# Patient Record
Sex: Male | Born: 1965
Health system: Southern US, Community
[De-identification: ages and names within clinical notes are randomized; demographics above are authoritative.]

## PROBLEM LIST (undated history)

## (undated) DIAGNOSIS — E079 Disorder of thyroid, unspecified: Secondary | ICD-10-CM

## (undated) HISTORY — PX: SHOULDER SURGERY: SHX246

## (undated) HISTORY — DX: Disorder of thyroid, unspecified: E07.9

---

## 2011-10-02 ENCOUNTER — Emergency Department: Payer: Self-pay | Admitting: *Deleted
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
## 2013-03-24 ENCOUNTER — Ambulatory Visit: Payer: Self-pay | Admitting: Family Medicine

## 2013-03-26 ENCOUNTER — Encounter: Payer: Self-pay | Admitting: *Deleted

## 2013-04-10 ENCOUNTER — Ambulatory Visit (INDEPENDENT_AMBULATORY_CARE_PROVIDER_SITE_OTHER): Payer: BC Managed Care – PPO | Admitting: General Surgery

## 2013-04-10 ENCOUNTER — Encounter: Payer: Self-pay | Admitting: General Surgery

## 2013-04-10 VITALS — BP 142/80 | HR 88 | Resp 16 | Ht 71.0 in | Wt 224.0 lb

## 2013-04-10 DIAGNOSIS — E042 Nontoxic multinodular goiter: Secondary | ICD-10-CM | POA: Insufficient documentation

## 2013-04-10 NOTE — Progress Notes (Signed)
Patient ID: Luis Ade., male   DOB: September 01, 1966, 47 y.o.   MRN: [id number]  Chief Complaint  Patient presents with  . Other    evaluation of thryoid - multiple nodules bilaterally     HPI [name] is a 47 y.o. male who presents for an evaluation of his thyroid. Multiple nodules noted bilaterally. The patient denies any problems at this time. The patient was diagnosed at age 2 with hypothyroidism. He has no details of any evaluations done then.   HPI  Past Medical History  Diagnosis Date  . Thyroid disease     Past Surgical History  Procedure Laterality Date  . Shoulder surgery Right     History reviewed. No pertinent family history.  Social History History  Substance Use Topics  . Smoking status: Never Smoker   . Smokeless tobacco: Not on file  . Alcohol Use: No    No Known Allergies  Current Outpatient Prescriptions  Medication Sig Dispense Refill  . levothyroxine (SYNTHROID, LEVOTHROID) 200 MCG tablet Take 200 mcg by mouth daily before breakfast.       No current facility-administered medications for this visit.    Review of Systems Review of Systems  Constitutional: Negative.   Respiratory: Negative.   Cardiovascular: Negative.     Blood pressure 142/80, pulse 88, resp. rate 16, height 5\' 11"  (1.803 m), weight 224 lb (101.606 kg).  Physical Exam Physical Exam  Constitutional: He is oriented to person, place, and time. He appears well-developed and well-nourished.  Eyes: Conjunctivae are normal. No scleral icterus.  Neck:  Right lobe of thyroid is a little enlarged. Soft.   Cardiovascular: Normal rate, regular rhythm and normal heart sounds.   No murmur heard. Pulmonary/Chest: Effort normal and breath sounds normal.  Lymphadenopathy:    He has no cervical adenopathy.    He has no axillary adenopathy.  Neurological: He is alert and oriented to person, place, and time.  Skin: Skin is warm and dry.    Data Reviewed Ultrasound  report showing multiple nodules in both lobes.   Assessment    Given the long standing history of hypothyroid state the ultrasound findings are also likely chronic and can be followed.      Plan    Patient to return in 6 months for follow up with in office thyroid ultrasound. Discussed assessment and plan in full with patient.        Song Myre G 04/11/2013, 6:21 AM

## 2013-04-10 NOTE — Patient Instructions (Addendum)
Patient to return in 6 months for follow up with in office thyroid ultrasound.

## 2013-04-11 ENCOUNTER — Encounter: Payer: Self-pay | Admitting: General Surgery

## 2013-10-14 ENCOUNTER — Ambulatory Visit: Payer: Self-pay | Admitting: General Surgery

## 2013-10-16 ENCOUNTER — Encounter: Payer: Self-pay | Admitting: *Deleted

## 2014-08-13 IMAGING — US THYROID ULTRASOUND
1 series · 13 of 25 positions shown · non-contrast
Comparison: none

REASON FOR EXAM: Goiter
COMMENTS:

[Series 1: thyroid ultrasound · 0.11mm/px · 13 of 113 slices shown]
[im 1/113]
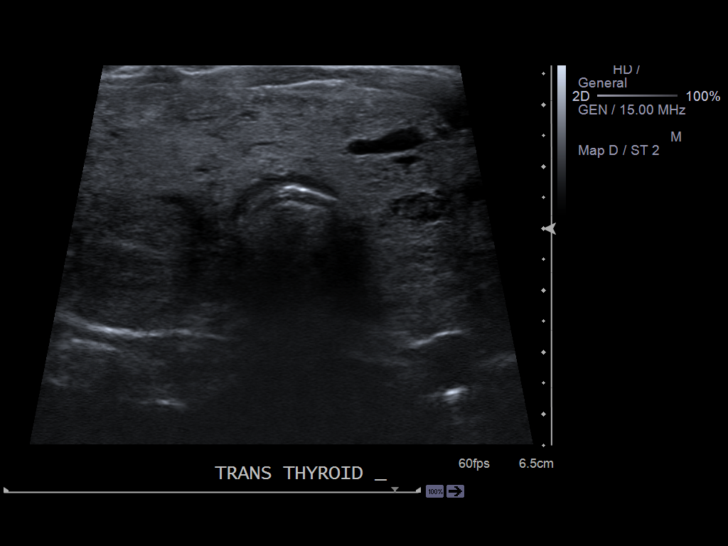
[im 10/113]
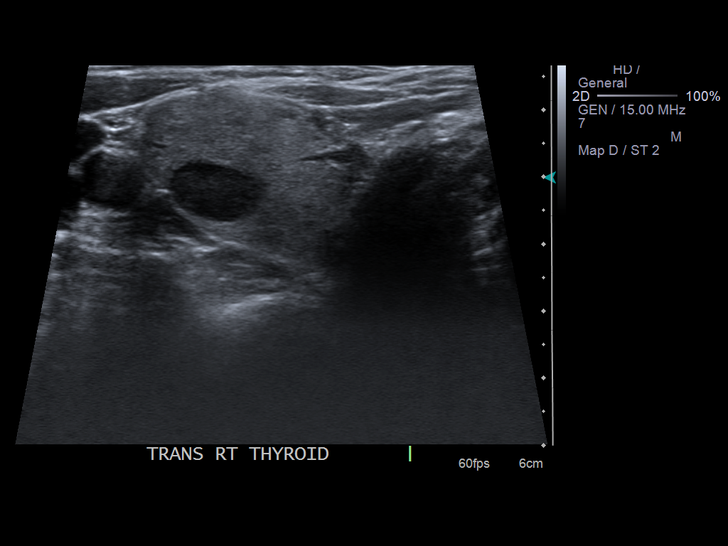
[im 19/113]
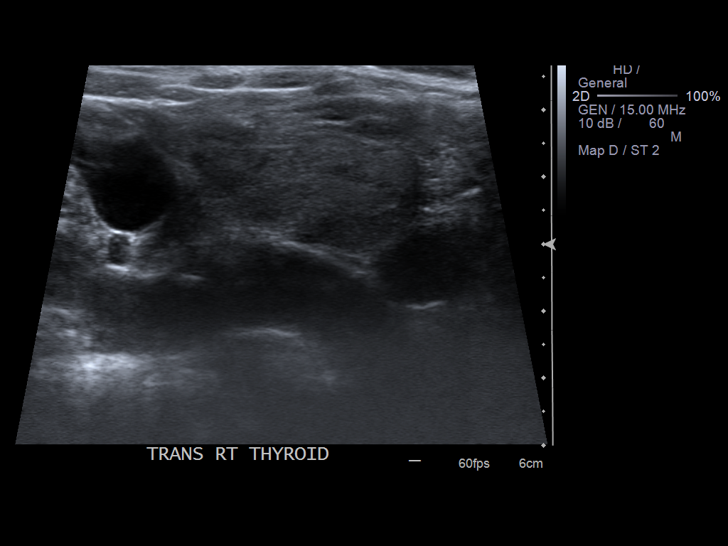
[im 29/113]
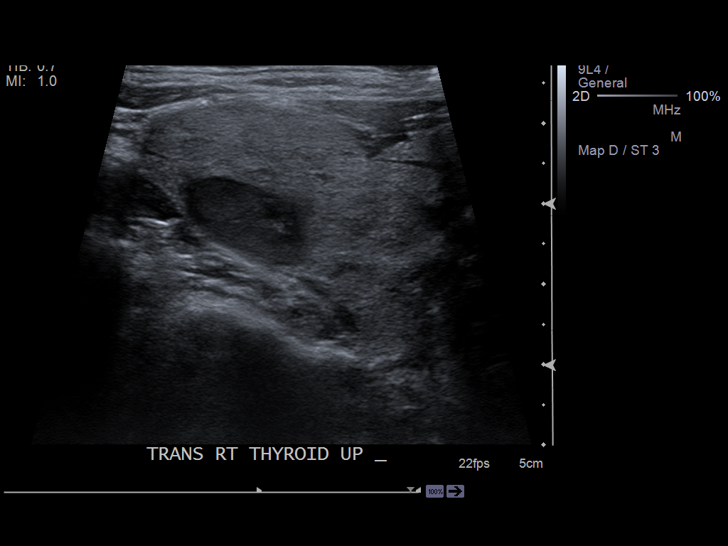
[im 38/113]
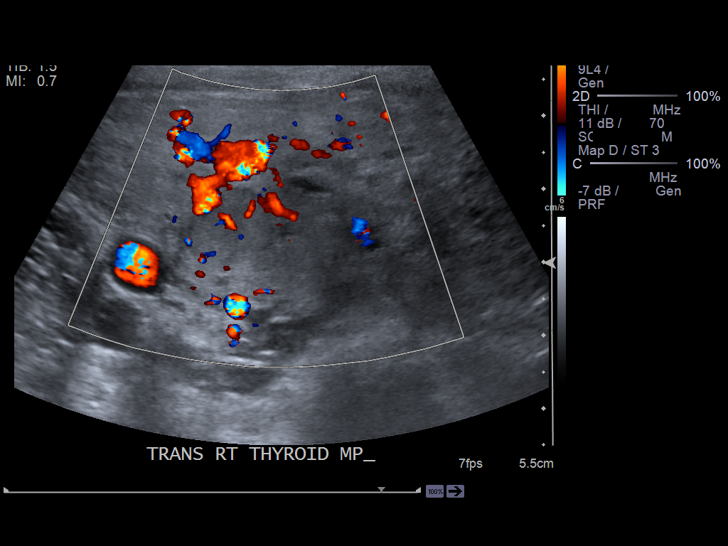
[im 47/113]
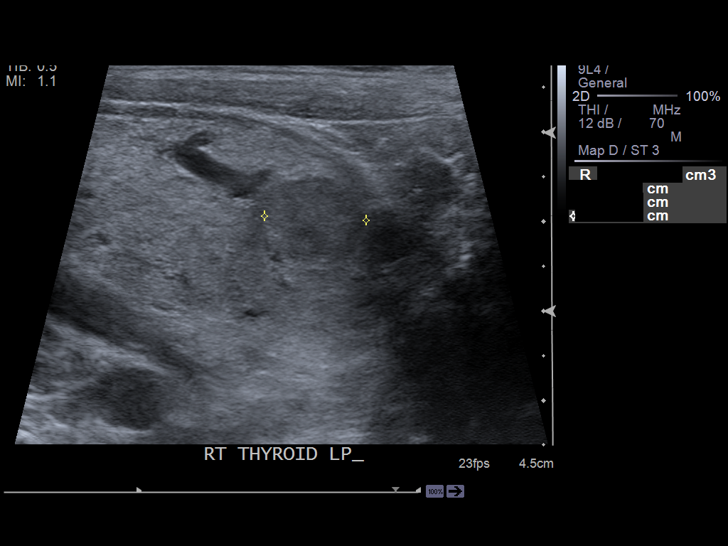
[im 57/113]
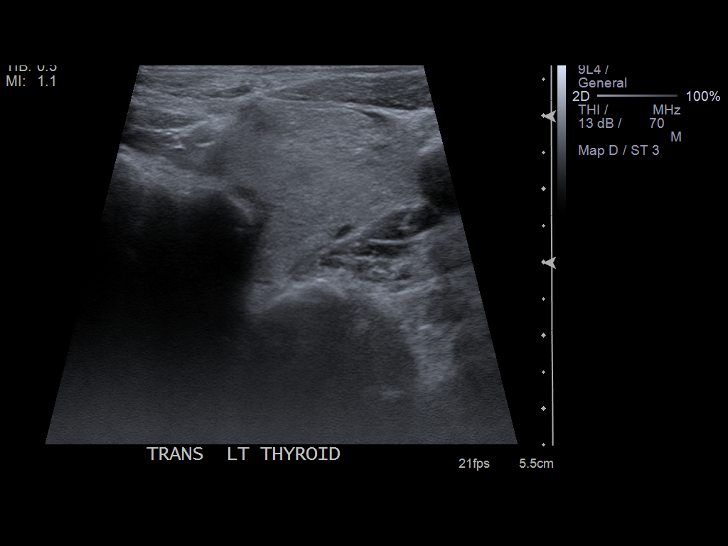
[im 66/113]
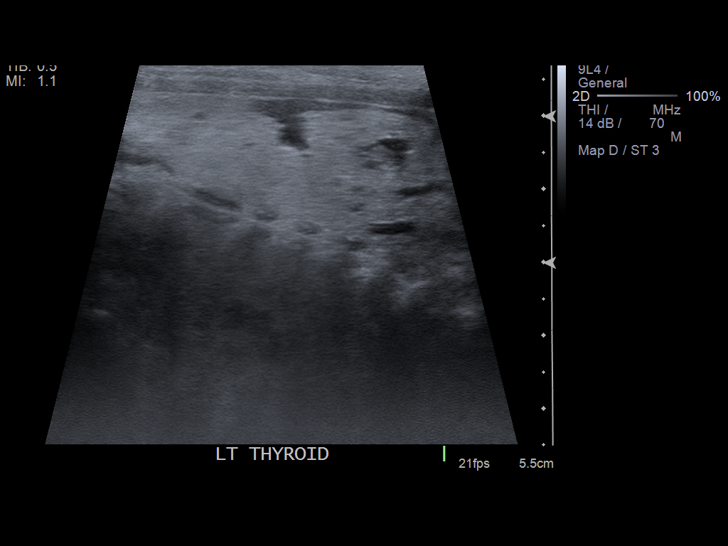
[im 75/113]
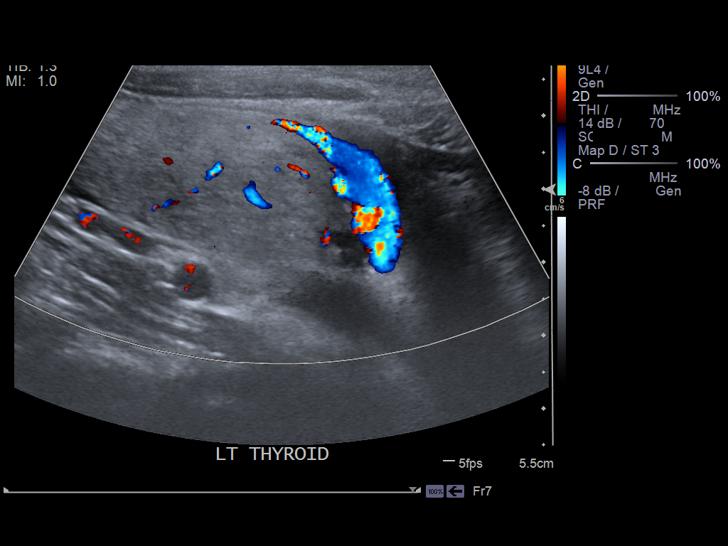
[im 85/113]
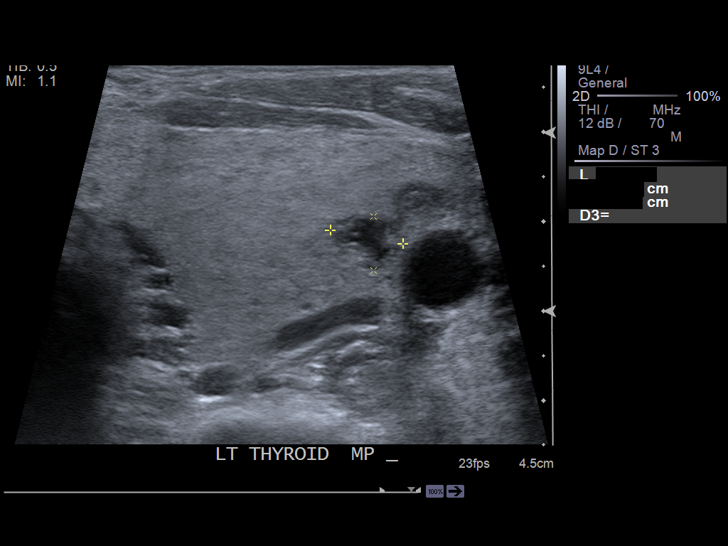
[im 94/113]
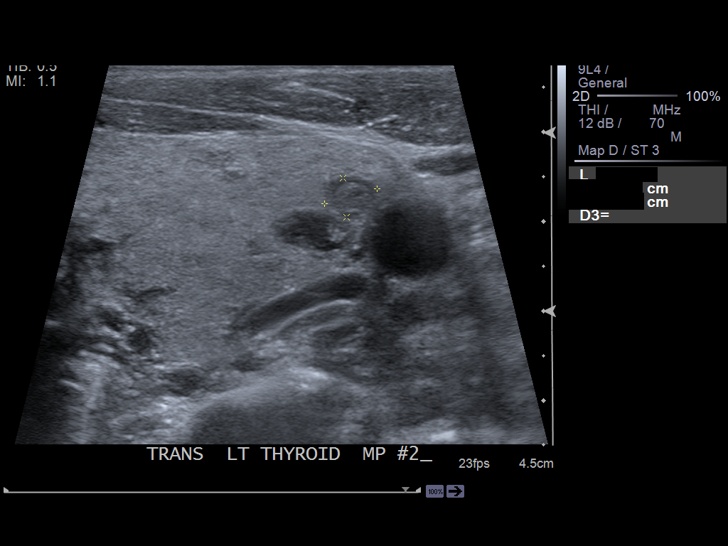
[im 103/113]
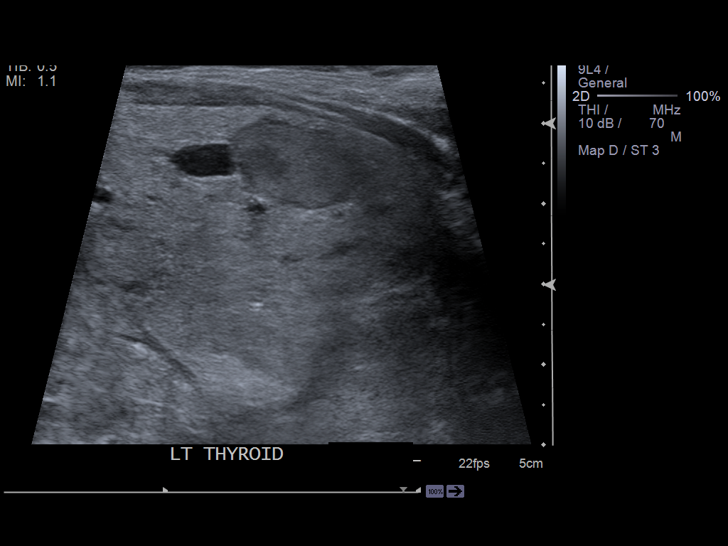
[im 113/113]
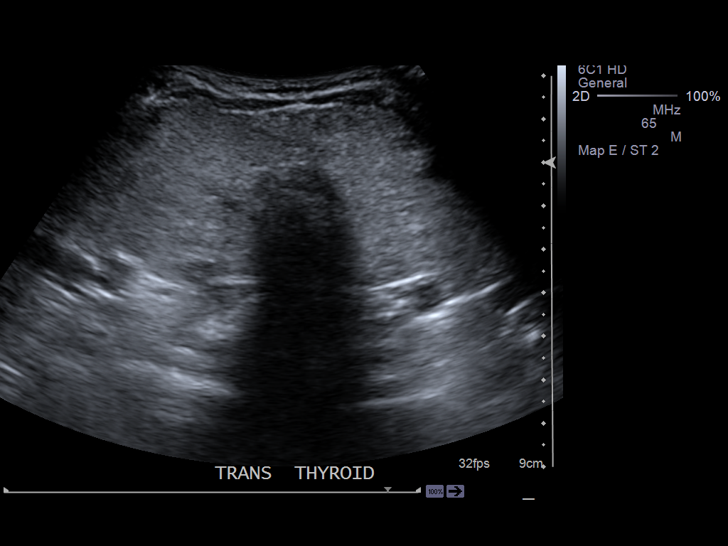

[13 of 25 positions shown; findings below may reference images not displayed]

PROCEDURE:     TIGER - TIGER SOFT TISSUE HEAD/NECK/THYROI  - March 24, 2013  [DATE]

RESULT:     The thyroid gland is enlarged. The right thyroid lobe measures
7.7 x 4.9 x 3.6 cm. The left thyroid lobe measures 6.7 x 4.6 x 3.3 cm.
Multiple nodules are noted in both thyroid lobes. The thyroid isthmus is
enlarged measuring 1.42 cm.

On the right in the upper pole there is a hypoechoic vascular nodule
measuring 1.3 x 0.8 x 2.3 cm. In the midpole there is a slightly hypoechoic
mass demonstrating vascularity measuring 1.2 x 1.1 x 1.1 cm. In the lower
pole there is a hypoechoic nodule with vascularity measuring 1.3 x 0.9 x
centimeters.

In the upper pole of the left thyroid lobe a complex cystic structure with
associated calcifications is demonstrated measuring 0.5 x 0.3 x 0.5 cm. In
the midpole a complex cystic-appearing structure measures 0.8 x 0.6 x
cm. Also in the midpole a hypoechoic nodule measures 0.6 x 0.4 x 0.6 cm. In
the lower pole there is a slightly hypoechoic mass demonstrating vascularity
measuring 1.3 x 1.0 x 1.67 cm.
IMPRESSION: There is enlargement of both thyroid lobes and multiple
nodules are demonstrated.

[REDACTED]

## 2015-07-27 ENCOUNTER — Ambulatory Visit (INDEPENDENT_AMBULATORY_CARE_PROVIDER_SITE_OTHER): Payer: Self-pay | Admitting: Family Medicine

## 2015-07-27 ENCOUNTER — Encounter: Payer: Self-pay | Admitting: Family Medicine

## 2015-07-27 VITALS — BP 116/77 | HR 94 | Temp 98.7°F | Resp 18 | Ht 71.0 in | Wt 215.8 lb

## 2015-07-27 DIAGNOSIS — J40 Bronchitis, not specified as acute or chronic: Secondary | ICD-10-CM

## 2015-07-27 DIAGNOSIS — L309 Dermatitis, unspecified: Secondary | ICD-10-CM | POA: Insufficient documentation

## 2015-07-27 DIAGNOSIS — J069 Acute upper respiratory infection, unspecified: Secondary | ICD-10-CM

## 2015-07-27 MED ORDER — BETAMETHASONE DIPROPIONATE 0.05 % EX CREA: TOPICAL_CREAM | Freq: Two times a day (BID) | CUTANEOUS | Status: DC

## 2015-07-27 MED ORDER — AZITHROMYCIN 250 MG PO TABS: ORAL_TABLET | ORAL | Status: DC

## 2015-07-27 MED ORDER — GUAIFENESIN-CODEINE 100-10 MG/5ML PO SOLN: 5.0000 mL | Freq: Every evening | ORAL | Status: DC | PRN

## 2015-07-27 MED ORDER — PSEUDOEPHEDRINE HCL 60 MG PO TABS: 60.0000 mg | ORAL_TABLET | Freq: Four times a day (QID) | ORAL | Status: DC | PRN

## 2015-07-27 MED ORDER — BENZONATATE 100 MG PO CAPS: 100.0000 mg | ORAL_CAPSULE | Freq: Three times a day (TID) | ORAL | Status: DC | PRN

## 2015-07-27 NOTE — Assessment & Plan Note (Signed)
2/2 transmission fluid. Treat with diprolene cream PRN. Recommend Amlactin lotions or Vaseline intensive lotion.

## 2015-07-27 NOTE — Patient Instructions (Signed)
Your symptoms are consistent with a viral upper respiratory infection. At this time there is no need for antibiotics.  If your symptoms persist for > 10 days or get better and than worsen please let me know. You may have a secondary bacterial infection.  You can use supportive care at home to help with your symptoms.I have also sent tesslon perles to your pharmacy to help with the cough- you can take these 3 times daily as needed. Honey is a natural cough suppressant- so add it to your tea in the morning.  If you have a humidifer, set that up in your bedroom at night.   Please seek immediate medical attention if you develop shortness of breath not relieve by inhaler, chest pain/tightness, fever > 103 F or other concerning symptoms.

## 2015-07-27 NOTE — Progress Notes (Signed)
Subjective:    Patient ID: Luis Ade., male    DOB: 1966/05/10, 49 y.o.   MRN: [id number]  HPI: Luis Huy. is a 49 y.o. male presenting on 07/27/2015 for No chief complaint on file.   HPI  Cough cold congestion. Symptoms 4-5 days ago. Worst symptom is coughing. Keeps him awake at night. No known fevers at home. Mild sinus pressure. No pain in ears. Chest hurts when coughing. No chest tightness or shortness of breath.   Past Medical History  Diagnosis Date  . Thyroid disease     Current Outpatient Prescriptions on File Prior to Visit  Medication Sig  . levothyroxine (SYNTHROID, LEVOTHROID) 200 MCG tablet Take 200 mcg by mouth daily before breakfast.   No current facility-administered medications on file prior to visit.    Review of Systems  Constitutional: Negative for fever and chills.  HENT: Positive for congestion, rhinorrhea and sneezing. Negative for ear pain and postnasal drip.   Respiratory: Positive for cough. Negative for chest tightness, shortness of breath and wheezing.   Cardiovascular: Negative for chest pain, palpitations and leg swelling.  Skin: Positive for rash.  Allergic/Immunologic: Negative for environmental allergies.  Neurological: Negative for headaches.   Per HPI unless specifically indicated above     Objective:    BP 116/77 mmHg  Pulse 94  Temp(Src) 98.7 F (37.1 C) (Oral)  Resp 18  Ht  (1.803 m)  Wt 215 lb 12.8 oz (97.886 kg)  BMI 30.11 kg/m2  SpO2 97%  Wt Readings from Last 3 Encounters:  07/27/15 215 lb 12.8 oz (97.886 kg)  04/10/13 224 lb (101.606 kg)    Physical Exam  Constitutional: He appears well-developed and well-nourished. No distress.  HENT:  Head: Normocephalic and atraumatic.  Right Ear: Hearing and tympanic membrane normal. Tympanic membrane is not erythematous and not bulging.  Left Ear: Hearing and tympanic membrane normal. Tympanic membrane is not erythematous and not bulging.  Nose: Mucosal  edema and rhinorrhea present. No sinus tenderness or nasal septal hematoma. Right sinus exhibits no maxillary sinus tenderness and no frontal sinus tenderness. Left sinus exhibits no maxillary sinus tenderness and no frontal sinus tenderness.  Mouth/Throat: Uvula is midline and mucous membranes are normal. No uvula swelling. Posterior oropharyngeal erythema present. No posterior oropharyngeal edema.  Neck: Neck supple. No Brudzinski's sign and no Kernig's sign noted. Thyromegaly (known goiter. ) present.  Cardiovascular: Normal rate, regular rhythm and normal heart sounds.   Pulmonary/Chest: No accessory muscle usage. No tachypnea. No respiratory distress. He has wheezes (with coughing. ) in the right lower field and the left lower field. He has no rhonchi. He has no rales. Chest wall is not dull to percussion. He exhibits no tenderness.  Lymphadenopathy:    He has no cervical adenopathy.  Skin:  Dry cracked skin bilateral hands.    No results found for this or any previous visit.    Assessment & Plan:   Problem List Items Addressed This Visit      Respiratory   Upper respiratory infection - Primary    Likely viral etiology. Paper prescription and instructions for filling given since holiday weekend. Encouraged home care. S/s secondary bacterial infection reviewed. Alarm symptoms reviewed.       Relevant Medications   azithromycin (ZITHROMAX) 250 MG tablet     Musculoskeletal and Integument   Chronic dermatitis of hands    2/2 transmission fluid. Treat with diprolene cream PRN. Recommend Amlactin lotions or Vaseline intensive  lotion.       Relevant Medications   betamethasone dipropionate (DIPROLENE) 0.05 % cream    Other Visit Diagnoses    Bronchitis        Likley viral. Zpak given if symptoms do not improve over the weekend.     Relevant Medications    benzonatate (TESSALON) 100 MG capsule    guaiFENesin-codeine 100-10 MG/5ML syrup       Meds ordered this encounter    Medications  . betamethasone dipropionate (DIPROLENE) 0.05 % cream    Sig: Apply topically 2 (two) times daily.    Dispense:  30 g    Refill:  3    Order Specific Question:  Supervising Provider    Answer:  Janeann ForehandHAWKINS JR, JAMES H [[id number]]  . benzonatate (TESSALON) 100 MG capsule    Sig: Take 1 capsule (100 mg total) by mouth 3 (three) times daily as needed for cough.    Dispense:  30 capsule    Refill:  0    Order Specific Question:  Supervising Provider    Answer:  Janeann ForehandHAWKINS JR, JAMES H [[id number]]  . guaiFENesin-codeine 100-10 MG/5ML syrup    Sig: Take 5 mLs by mouth at bedtime as needed for cough.    Dispense:  120 mL    Refill:  0    Order Specific Question:  Supervising Provider    Answer:  Janeann ForehandHAWKINS JR, JAMES H [[id number]]  . azithromycin (ZITHROMAX) 250 MG tablet    Sig: Take 2 pills on day 1 and 1 pill every subsequent day until bottle is empty.    Dispense:  6 tablet    Refill:  0    Order Specific Question:  Supervising Provider    Answer:  Janeann ForehandHAWKINS JR, JAMES H [[id number]]  . pseudoephedrine (SUDAFED) 60 MG tablet    Sig: Take 1 tablet (60 mg total) by mouth every 6 (six) hours as needed for congestion.    Dispense:  30 tablet    Refill:  0    Order Specific Question:  Supervising Provider    Answer:  Janeann ForehandHAWKINS JR, JAMES H [[id number]]      Follow up plan: Return if symptoms worsen or fail to improve.

## 2015-07-27 NOTE — Assessment & Plan Note (Signed)
Likely viral etiology. Paper prescription and instructions for filling given since holiday weekend. Encouraged home care. S/s secondary bacterial infection reviewed. Alarm symptoms reviewed.

## 2015-12-16 ENCOUNTER — Telehealth: Payer: Self-pay | Admitting: Family Medicine

## 2015-12-16 MED ORDER — LEVOTHYROXINE SODIUM 200 MCG PO TABS: 200.0000 ug | ORAL_TABLET | Freq: Every day | ORAL | Status: DC

## 2015-12-16 NOTE — Telephone Encounter (Signed)
Done. Sent to [facility].

## 2015-12-16 NOTE — Telephone Encounter (Signed)
Pt has appt on Monday 17th but will be out of medication before then.  [facility] [place]  His call back number is [phone]

## 2015-12-20 ENCOUNTER — Ambulatory Visit: Payer: Self-pay | Admitting: Family Medicine

## 2015-12-20 ENCOUNTER — Encounter: Payer: Self-pay | Admitting: Family Medicine

## 2015-12-21 ENCOUNTER — Encounter: Payer: Self-pay | Admitting: Family Medicine

## 2015-12-21 ENCOUNTER — Ambulatory Visit (INDEPENDENT_AMBULATORY_CARE_PROVIDER_SITE_OTHER): Payer: Self-pay | Admitting: Family Medicine

## 2015-12-21 VITALS — BP 129/87 | HR 94 | Temp 98.1°F | Resp 16 | Ht 71.0 in | Wt 219.0 lb

## 2015-12-21 DIAGNOSIS — Z833 Family history of diabetes mellitus: Secondary | ICD-10-CM

## 2015-12-21 DIAGNOSIS — M7711 Lateral epicondylitis, right elbow: Secondary | ICD-10-CM | POA: Diagnosis not present

## 2015-12-21 DIAGNOSIS — E042 Nontoxic multinodular goiter: Secondary | ICD-10-CM

## 2015-12-21 DIAGNOSIS — E785 Hyperlipidemia, unspecified: Secondary | ICD-10-CM | POA: Diagnosis not present

## 2015-12-21 MED ORDER — NAPROXEN 500 MG PO TABS: 500.0000 mg | ORAL_TABLET | Freq: Two times a day (BID) | ORAL | Status: DC

## 2015-12-21 NOTE — Assessment & Plan Note (Signed)
Last US 2014- stable. Seen by ENT last year, recommended f/u US in 5 years (2019). Doing well on 200mcg levothyroxine. Check TSH today. RTC 6 mos.

## 2015-12-21 NOTE — Progress Notes (Signed)
Subjective:    Patient ID: Luis AdeBruce D Coffin Jr., male    DOB: [birth year], 50 y.o.   MRN: [id number]  HPI: Luis AdeBruce D Ruggirello Jr. is a 50 y.o. male presenting on 12/21/2015 for Hypothyroidism   HPI  Pt presents for follow-up of thyroid. Has multi-nodular goiter. Last US in our system was 2014. Pt saw [facility] last year- was told it was stable. Wanted to get an ultrasound 5 year follow-up. No issues swallowing. Feeling well. No cold intolerance. No weight gain. No weight loss. No heat intolerance.  Pt also reporting R elbow pain. Pain with repeated movements. Pain at lateral epicondyle. Pain is 4/10. Mild relief with advil. Pt would like a know if a brace would be helpful.  Would like health maintenance labs today. Has not had cholesterol checked in several years. Stays active with job but drinks 5-6 sodas per day. Would like to screen for diabetes.     Past Medical History  Diagnosis Date  . Thyroid disease     Current Outpatient Prescriptions on File Prior to Visit  Medication Sig  . betamethasone dipropionate (DIPROLENE) 0.05 % cream Apply topically 2 (two) times daily.  Marland Kitchen. levothyroxine (SYNTHROID, LEVOTHROID) 200 MCG tablet Take 1 tablet (200 mcg total) by mouth daily before breakfast.   No current facility-administered medications on file prior to visit.    Review of Systems  Constitutional: Negative for fever and chills.  HENT: Negative.   Respiratory: Negative for chest tightness, shortness of breath and wheezing.   Cardiovascular: Negative for chest pain, palpitations and leg swelling.  Gastrointestinal: Negative for nausea, vomiting and abdominal pain.  Endocrine: Negative.   Genitourinary: Negative for dysuria, urgency, discharge, penile pain and testicular pain.  Musculoskeletal: Negative for back pain, joint swelling and arthralgias.  Skin: Negative.   Neurological: Negative for dizziness, weakness, numbness and headaches.  Psychiatric/Behavioral: Negative for sleep  disturbance and dysphoric mood.   Per HPI unless specifically indicated above     Objective:    BP 129/87 mmHg  Pulse 94  Temp(Src) 98.1 F (36.7 C) (Oral)  Resp 16  Ht 5\' 11"  (1.803 m)  Wt 219 lb (99.338 kg)  BMI 30.56 kg/m2  Wt Readings from Last 3 Encounters:  12/21/15 219 lb (99.338 kg)  07/27/15 215 lb 12.8 oz (97.886 kg)  04/10/13 224 lb (101.606 kg)    Physical Exam  Constitutional: He is oriented to person, place, and time. He appears well-developed and well-nourished. No distress.  HENT:  Head: Normocephalic and atraumatic.  Neck: Normal range of motion. Neck supple. Thyromegaly present.  Cardiovascular: Normal rate, regular rhythm and normal heart sounds.  Exam reveals no gallop and no friction rub.   No murmur heard. Pulmonary/Chest: Effort normal and breath sounds normal. He has no wheezes.  Abdominal: Soft. Bowel sounds are normal. He exhibits no distension. There is no tenderness. There is no rebound.  Musculoskeletal: Normal range of motion. He exhibits no edema.       Right elbow: Tenderness found. Lateral epicondyle tenderness noted.  Lymphadenopathy:    He has no cervical adenopathy.  Neurological: He is alert and oriented to person, place, and time. He has normal reflexes.  Skin: Skin is warm and dry. No rash noted. No erythema.  Psychiatric: He has a normal mood and affect. His behavior is normal. Thought content normal.   No results found for this or any previous visit.    Assessment & Plan:   Problem List Items Addressed This Visit  Endocrine   Multinodular goiter - Primary    Last [place] 2014- stable. Seen by ENT last year, recommended f/u US in 5 years (2019). Doing well on levothyroxine. Check TSH today. RTC 6 mos.       Relevant Orders   TSH   Comprehensive metabolic panel    Other Visit Diagnoses    Mild hyperlipidemia        Relevant Orders    Lipid panel    Epicondylitis, lateral (tennis elbow), right        Trial of  naproxen BID x1 week. Switch to OTC. Recommend OTC brace for pain. Return if symptoms not improving.     Relevant Medications    naproxen (NAPROSYN) 500 MG tablet    Family history of diabetes mellitus        Screen hemoglobin A1c. Discussed diabetes prevention.     Relevant Orders    Hemoglobin A1c       Meds ordered this encounter  Medications  . naproxen (NAPROSYN) 500 MG tablet    Sig: Take 1 tablet (500 mg total) by mouth 2 (two) times daily with a meal.    Dispense:  30 tablet    Refill:  0    Order Specific Question:  Supervising Provider    Answer:  Janeann Forehand [[id number]]      Follow up plan: Return in about 6 months (around 06/21/2016).

## 2015-12-21 NOTE — Patient Instructions (Signed)
Tennis Elbow Tennis elbow (lateral epicondylitis) is inflammation of the outer tendons of your forearm close to your elbow. Your tendons attach your muscles to your bones. The outer tendons of your forearm are used to extend your wrist, and they attach on the outside part of your elbow. Tennis elbow is often found in people who play tennis, but anyone may get the condition from repeatedly extending the wrist or turning the forearm. CAUSES This condition is caused by repeatedly extending your wrist and using your hands. It can result from sports or work that requires repetitive forearm movements. Tennis elbow may also be caused by an injury. RISK FACTORS You have a higher risk of developing tennis elbow if you play tennis or another racquet sport. You also have a higher risk if you frequently use your hands for work. This condition is also more likely to develop in:  Musicians.  Carpenters, painters, and plumbers.  Cooks.  Cashiers.  People who work in factories.  Construction workers.  Butchers.  People who use computers. SYMPTOMS Symptoms of this condition include:  Pain and tenderness in your forearm and the outer part of your elbow. You may only feel the pain when you use your arm, or you may feel it even when you are not using your arm.  A burning feeling that runs from your elbow through your arm.  Weak grip in your hands. DIAGNOSIS  This condition may be diagnosed by medical history and physical exam. You may also have other tests, including:  X-rays.  MRI. TREATMENT Your health care provider will recommend lifestyle adjustments, such as resting and icing your arm. Treatment may also include:  Medicines for inflammation. This may include shots of cortisone if your pain continues.  Physical therapy. This may include massage or exercises.  An elbow brace. Surgery may eventually be recommended if your pain does not go away with treatment. HOME CARE  INSTRUCTIONS Activity  Rest your elbow and wrist as directed by your health care provider. Try to avoid any activities that caused the problem until your health care provider says that you can do them again.  If a physical therapist teaches you exercises, do all of them as directed.  If you lift an object, lift it with your palm facing upward. This lowers the stress on your elbow. Lifestyle  If your tennis elbow is caused by sports, check your equipment and make sure that:  You are using it correctly.  It is the best fit for you.  If your tennis elbow is caused by work, take breaks frequently, if you are able. Talk with your manager about how to best perform tasks in a way that is safe.  If your tennis elbow is caused by computer use, talk with your manager about any changes that can be made to your work environment. General Instructions  If directed, apply ice to the painful area:  Put ice in a plastic bag.  Place a towel between your skin and the bag.  Leave the ice on for 20 minutes, 2-3 times per day.  Take medicines only as directed by your health care provider.  If you were given a brace, wear it as directed by your health care provider.  Keep all follow-up visits as directed by your health care provider. This is important. SEEK MEDICAL CARE IF:  Your pain does not get better with treatment.  Your pain gets worse.  You have numbness or weakness in your forearm, hand, or fingers.     This information is not intended to replace advice given to you by your health care provider. Make sure you discuss any questions you have with your health care provider.   Document Released: 08/21/2005 Document Revised: 01/05/2015 Document Reviewed: 08/17/2014 Elsevier Interactive Patient Education 2016 [employer]  

## 2016-01-07 ENCOUNTER — Other Ambulatory Visit: Payer: Self-pay | Admitting: Family Medicine

## 2016-01-07 LAB — COMPREHENSIVE METABOLIC PANEL
ALBUMIN: 4.2 g/dL (ref 3.5–5.5)
ALK PHOS: 75 IU/L (ref 39–117)
ALT: 30 IU/L (ref 0–44)
AST: 25 IU/L (ref 0–40)
Albumin/Globulin Ratio: 1.4 (ref 1.2–2.2)
BUN/Creatinine Ratio: 11 (ref 9–20)
BUN: 10 mg/dL (ref 6–24)
Bilirubin Total: 0.5 mg/dL (ref 0.0–1.2)
CHLORIDE: 103 mmol/L (ref 96–106)
CO2: 25 mmol/L (ref 18–29)
CREATININE: 0.87 mg/dL (ref 0.76–1.27)
Calcium: 9.1 mg/dL (ref 8.7–10.2)
GFR, EST AFRICAN AMERICAN: 118 mL/min/{1.73_m2} (ref >59)
GFR, EST NON AFRICAN AMERICAN: 102 mL/min/{1.73_m2} (ref >59)
GLOBULIN, TOTAL: 3.1 g/dL (ref 1.5–4.5)
GLUCOSE: 108 mg/dL — AB (ref 65–99)
Potassium: 4 mmol/L (ref 3.5–5.2)
Sodium: 143 mmol/L (ref 134–144)
TOTAL PROTEIN: 7.3 g/dL (ref 6.0–8.5)

## 2016-01-07 LAB — TSH: TSH: 1.6 u[IU]/mL (ref 0.450–4.500)

## 2016-01-07 LAB — LIPID PANEL
CHOL/HDL RATIO: 4.4 ratio (ref 0.0–5.0)
Cholesterol, Total: 144 mg/dL (ref 100–199)
HDL: 33 mg/dL — ABNORMAL LOW (ref >39)
LDL CALC: 88 mg/dL (ref 0–99)
Triglycerides: 116 mg/dL (ref 0–149)
VLDL CHOLESTEROL CAL: 23 mg/dL (ref 5–40)

## 2016-01-07 LAB — HEMOGLOBIN A1C
ESTIMATED AVERAGE GLUCOSE: 105 mg/dL
HEMOGLOBIN A1C: 5.3 % (ref 4.8–5.6)

## 2016-01-07 MED ORDER — LEVOTHYROXINE SODIUM 200 MCG PO TABS: 200.0000 ug | ORAL_TABLET | Freq: Every day | ORAL | Status: DC

## 2016-05-09 ENCOUNTER — Encounter: Payer: Self-pay | Admitting: Family Medicine

## 2016-05-09 ENCOUNTER — Ambulatory Visit (INDEPENDENT_AMBULATORY_CARE_PROVIDER_SITE_OTHER): Payer: BC Managed Care – PPO | Admitting: Family Medicine

## 2016-05-09 VITALS — BP 140/98 | HR 75 | Temp 97.5°F | Resp 16 | Ht 71.0 in | Wt 220.2 lb

## 2016-05-09 DIAGNOSIS — M545 Low back pain, unspecified: Secondary | ICD-10-CM

## 2016-05-09 MED ORDER — NAPROXEN 500 MG PO TABS: 500.0000 mg | ORAL_TABLET | Freq: Two times a day (BID) | ORAL | 1 refills | Status: DC

## 2016-05-09 NOTE — Progress Notes (Signed)
Subjective:    Patient ID: Luis Ade., male    DOB: [birth year], 50 y.o.   MRN: [id number]  Luis Ellenbecker. is a 50 y.o. male presenting on 05/09/2016 for Back Pain (onset 4 to 5 days)   HPI   LOW BACK PAIN, Acute Right: - Reports new onset symptoms started about 1 week ago without known inciting injury. Today seems to be worsening gradually today, woke up with about 5/10 pain significantly worse with movement, bending forward and twisting certain directions, currently sitting at rest without activity very minimal 1/10, previously pain only 2/10 and mostly intermittent. Currently employed building transmissions, some heavy lifting, also at home with playing with kids. No pain radiating to legs. - Has not tried any ibuprofen / NSAID for this flare - No history of prior lumbar OA/DJD, back surgery. No prior injury or back pain. - No history of kidney stone - Denies any fevers/chills, numbness, tingling, weakness, loss of control bladder/bowel incontinence or retention, unintentional wt loss, night sweats, dysuria, hematuria   Social History  Substance Use Topics  . Smoking status: Never Smoker  . Smokeless tobacco: Never Used  . Alcohol use No    Review of Systems Per HPI unless specifically indicated above     Objective:    BP (!) 140/98 (BP Location: Right Arm, Patient Position: Sitting, Cuff Size: Normal)   Pulse 75   Temp 97.5 F (36.4 C) (Oral)   Resp 16   Ht 5\' 11"  (1.803 m)   Wt 220 lb 3.2 oz (99.9 kg)   BMI 30.71 kg/m   Wt Readings from Last 3 Encounters:  05/09/16 220 lb 3.2 oz (99.9 kg)  12/21/15 219 lb (99.3 kg)  07/27/15 215 lb 12.8 oz (97.9 kg)    Physical Exam  Constitutional: He appears well-developed and well-nourished. No distress.  Well-appearing, comfortable, cooperative  Neck: Normal range of motion. Neck supple.  Cardiovascular: Normal rate, regular rhythm and normal heart sounds.   No murmur heard. Pulmonary/Chest: Effort normal and  breath sounds normal. No respiratory distress. He has no wheezes. He has no rales. He exhibits no tenderness.  Abdominal: Soft. Bowel sounds are normal. He exhibits no distension and no mass. There is no tenderness.  Musculoskeletal:  Low Back Inspection: Normal appearance, no spinal deformity, symmetrical. Palpation: No tenderness over spinous processes. Right lower lumbar paraspinal mild tenderness to palpation with minimal muscle spasm or hypertonicity ROM: Full active ROM forward flex / back extension, rotation L/R, very mild discomfort with full back flexion or extension Special Testing: Seated SLR negative for radicular pain bilaterally, but Right SLR with reproduced localized R back pain Strength: Bilateral hip flex/ext 5/5, knee flex/ext 5/5, ankle dorsiflex/plantarflex 5/5 Neurovascular: intact distal sensation to light touch   Neurological: He is alert.  Skin: Skin is warm and dry. He is not diaphoretic.  Nursing note and vitals reviewed.      Assessment & Plan:   Problem List Items Addressed This Visit    None    Visit Diagnoses    Acute right-sided low back pain without sciatica    -  Primary  Acute R LBP without associated sciatica. Suspect likely due to muscle spasm/strain with frequent lifting/strain on back but without known injury or trauma. No prior chronic LBP or DJD. - No red flag symptoms. Negative SLR for radiculopathy - Inadequate conservative therapy   Plan: 1. Start anti-inflammatory trial with rx Naprosyn 500mg  BID wc x 2-4 weeks, then PRN 2.  May use Tylenol PRN for breakthrough 3. Encouraged use of heating pad 1-2x daily for now then PRN 4. RTC 6 weeks, re-evaluation. If not improved consider X-ray imaging, muscle relaxant, trial PT         Meds ordered this encounter  Medications  . naproxen (NAPROSYN) 500 MG tablet    Sig: Take 1 tablet (500 mg total) by mouth 2 (two) times daily with a meal. For 2 weeks then as needed    Dispense:  30 tablet     Refill:  1      Follow up plan: Return in about 6 weeks (around 06/20/2016) for low back pain.   Saralyn PilarAlexander Jacoby Zanni, DO [facility] 05/09/2016, 9:10 AM

## 2016-05-09 NOTE — Patient Instructions (Signed)
Thank you for coming in to clinic today.  1. For your Back Pain - I think that this is due to Muscle Spasms or strain. You have no sign of nerve, disc, or other spine injury. Unlikely kidney stone or abdominal problem. 2. Start with anti-inflammatory Naprosyn (Naproxen) 500mg  twice daily (12 hrs apart, with food, breakfast and dinner) every day for next 2 to 4 weeks if helping, then can use only as needed 3. May use Tylenol Extra Str 500mg  tabs - may take 1-2 tablets every 6 hours as needed 4. Recommend to start using heating pad on your lower back 1-2x daily for few weeks  This pain may take weeks to months to fully resolve, but hopefully it will respond to the medicine initially. All back injuries (small or serious) are slow to heal since we use our back muscles every day. Be careful with turning, twisting, lifting, sitting / standing for prolonged periods, and avoid re-injury.  If your symptoms significantly worsen with more pain, or new symptoms with weakness in one or both legs, new or different shooting leg pains, numbness in legs or groin, loss of control or retention of urine or bowel movements, please call back for advice and you may need to go directly to the Emergency Department.  Please schedule a follow-up appointment with Dr. Althea CharonKaramalegos in 6 weeks to follow-up Low Back Pain if still present or sooner if worsening  If you have any other questions or concerns, please feel free to call the clinic or send a message through MyChart. You may also schedule an earlier appointment if necessary.  Saralyn PilarAlexander Sydnei Ohaver, DO [facility]

## 2016-09-28 ENCOUNTER — Other Ambulatory Visit: Payer: Self-pay | Admitting: Family Medicine

## 2016-09-28 ENCOUNTER — Ambulatory Visit (INDEPENDENT_AMBULATORY_CARE_PROVIDER_SITE_OTHER): Payer: BC Managed Care – PPO | Admitting: Family Medicine

## 2016-09-28 ENCOUNTER — Encounter: Payer: Self-pay | Admitting: Family Medicine

## 2016-09-28 VITALS — BP 135/83 | HR 87 | Temp 98.4°F | Resp 16 | Ht 71.0 in | Wt 221.0 lb

## 2016-09-28 DIAGNOSIS — R197 Diarrhea, unspecified: Secondary | ICD-10-CM | POA: Diagnosis not present

## 2016-09-28 DIAGNOSIS — E042 Nontoxic multinodular goiter: Secondary | ICD-10-CM

## 2016-09-28 DIAGNOSIS — Z0000 Encounter for general adult medical examination without abnormal findings: Secondary | ICD-10-CM

## 2016-09-28 DIAGNOSIS — E786 Lipoprotein deficiency: Secondary | ICD-10-CM | POA: Insufficient documentation

## 2016-09-28 DIAGNOSIS — R6889 Other general symptoms and signs: Secondary | ICD-10-CM

## 2016-09-28 MED ORDER — DICYCLOMINE HCL 10 MG PO CAPS: 10.0000 mg | ORAL_CAPSULE | Freq: Three times a day (TID) | ORAL | 0 refills | Status: DC

## 2016-09-28 NOTE — Patient Instructions (Signed)
Thank you for coming in to clinic today.  1. Most likely had flu previously, seems like that has run it's course. Diarrhea can be from antibiotic and/or stomach flu - No specific viral treatment - Sent rx for Bentyl to use for abdominal cramping / spasm, take one tab before each meal and at bedtime IF NEEDED for abdominal pain - Also my try Loperamide / Imodium OTC follow bottle, usually 4mg  or 2 pills first episode, then can repeat dose of 2 mg up to 2 hours later with each diarrhea episode, caution using too often only use for few days  IF symptoms significantly worsen with fevers/chills sweats, worsening abdominal pain, watery diarrhea blood in diarrhea, or other acute changes, then notify office for return follow-up or can go seek more immediate treatment at hospital.  You will be due for FASTING BLOOD WORK (no food or drink after midnight before, only water or coffee without cream/sugar on the morning of)  - Please go ahead and schedule a "Lab Only" visit in the morning at the clinic for lab draw in 3-4 months, AFTER 01/05/17 before next Annual Physical - Make sure Lab Only appointment is at least 1-2 weeks before your next appointment, so that results will be available  For Lab Results, once available within 2-3 days of blood draw, you can can log in to MyChart online to view your results and a brief explanation. Also, we can discuss results at next follow-up visit.  Please schedule a follow-up appointment with Dr. Althea CharonKaramalegos in 3-4 months for Annual Physical   If you have any other questions or concerns, please feel free to call the clinic or send a message through MyChart. You may also schedule an earlier appointment if necessary.  Saralyn PilarAlexander Amelia Burgard, DO [facility]

## 2016-09-28 NOTE — Progress Notes (Signed)
Subjective:    Patient ID: Luis AdeBruce D Massiah Jr., male    DOB: Oct 16, 1965, 51 y.o.   MRN: [id number]  Luis AdeBruce D Zehring Jr. is a 51 y.o. male presenting on 09/28/2016 for Abdominal Pain (onset week diarrhea pt thinks he had fever chills stomach cramps for week , flu like Sx is gone but still has stomach cramps)   HPI   ABDOMINAL CRAMPING / DIARRHEA / RECENT FLU-LIKE ILLNESS: Reports symptoms started 2 weeks ago with flu-like URI symptoms with congestion, cough, fevers, chills, muscle aches and generalized, also had some associated stomach flu symptoms with some diarrhea, then developed some cramping abdominal pain across abdomen. Tried various OTC cold / flu medications with some improvement, no positive contact with flu known, but in contact with large population - Over past few days to 1 week, now has resolved flu symptoms, but has lingering persistent abdominal pain and episodic diarrhea, some regular BMs in between, otherwise loose stools (not watery, non bloody). - Today moderately improved, less abdominal pain and cramping. Has been taking it easy and resting more recently. Still has some left sided abdominal cramping. Staying hydrated, tolerating bland foods and liquid well. - Also recently had a dental procedure root canal (Dr Pollyann SavoySheets, dentist) , was given 1 week of strong antibiotic, does not recall which one and was warned that he could get some diarrhea after this. No prior history of C.Diff - Tried OTC omeprazole once without improvement - Denies any current fevers/chills, sweats, nausea, vomiting, headache, chest pain, muscle aches, dyspnea  Social History  Substance Use Topics  . Smoking status: Never Smoker  . Smokeless tobacco: Never Used  . Alcohol use No    Review of Systems Per HPI unless specifically indicated above     Objective:    BP 135/83   Pulse 87   Temp 98.4 F (36.9 C) (Oral)   Resp 16   Ht 5\' 11"  (1.803 m)   Wt 221 lb (100.2 kg)   SpO2 99%   BMI 30.82  kg/m   Wt Readings from Last 3 Encounters:  09/28/16 221 lb (100.2 kg)  05/09/16 220 lb 3.2 oz (99.9 kg)  12/21/15 219 lb (99.3 kg)    Physical Exam  Constitutional: He appears well-developed and well-nourished. No distress.  Well-appearing, comfortable, cooperative  HENT:  Head: Normocephalic and atraumatic.  Mouth/Throat: Oropharynx is clear and moist.  Eyes: Conjunctivae are normal.  Cardiovascular: Normal rate, regular rhythm, normal heart sounds and intact distal pulses.   No murmur heard. Pulmonary/Chest: Effort normal and breath sounds normal. No respiratory distress. He has no wheezes. He has no rales.  Abdominal: Soft. Bowel sounds are normal. He exhibits no distension and no mass. There is tenderness (mild tenderness localized to left mid abdomen). There is no rebound and no guarding.  Musculoskeletal: He exhibits no edema.  Neurological: He is alert.  Skin: Skin is warm and dry. No rash noted. He is not diaphoretic. No erythema.  Psychiatric: His behavior is normal.  Nursing note and vitals reviewed.  I have personally reviewed the following lab results from 12/2015.  Results for orders placed or performed in visit on 12/21/15  TSH  Result Value Ref Range   TSH 1.600 0.450 - 4.500 uIU/mL  Comprehensive metabolic panel  Result Value Ref Range   Glucose 108 (H) 65 - 99 mg/dL   BUN 10 6 - 24 mg/dL   Creatinine, Ser 8.650.87 0.76 - 1.27 mg/dL   GFR calc non Af Denyse DagoAmer  102 >59 mL/min/1.73   GFR calc Af Amer 118 >59 mL/min/1.73   BUN/Creatinine Ratio 11 9 - 20   Sodium 143 134 - 144 mmol/L   Potassium 4.0 3.5 - 5.2 mmol/L   Chloride 103 96 - 106 mmol/L   CO2 25 18 - 29 mmol/L   Calcium 9.1 8.7 - 10.2 mg/dL   Total Protein 7.3 6.0 - 8.5 g/dL   Albumin 4.2 3.5 - 5.5 g/dL   Globulin, Total 3.1 1.5 - 4.5 g/dL   Albumin/Globulin Ratio 1.4 1.2 - 2.2   Bilirubin Total 0.5 0.0 - 1.2 mg/dL   Alkaline Phosphatase 75 39 - 117 IU/L   AST 25 0 - 40 IU/L   ALT 30 0 - 44 IU/L  Lipid  panel  Result Value Ref Range   Cholesterol, Total 144 100 - 199 mg/dL   Triglycerides 161 0 - 149 mg/dL   HDL 33 (L) >09 mg/dL   VLDL Cholesterol Cal 23 5 - 40 mg/dL   LDL Calculated 88 0 - 99 mg/dL   Chol/HDL Ratio 4.4 0.0 - 5.0 ratio units  Hemoglobin A1c  Result Value Ref Range   Hgb A1c MFr Bld 5.3 4.8 - 5.6 %   Est. average glucose Bld gHb Est-mCnc 105 mg/dL      Assessment & Plan:   Problem List Items Addressed This Visit    None    Visit Diagnoses    Flu-like symptoms    -  Primary - Resolved recent influenza like illness, >3 weeks ago, self treated with OTC meds, no confirmed flu test, no known flu contacts. - Now residual abdominal cramping / diarrhea, gradually improving, seems most likely related to influenza with viral GI symptoms seeing more recently in several patients. - Trial on Bentyl for cramping PRN - Can try OTC Loperamide/Imodium PRN for few doses - Continue improved hydration - Remain off Cold/Flu meds - Follow-up if not improved, return criteria given     Diarrhea, unspecified type       Relevant Medications   dicyclomine (BENTYL) 10 MG capsule - Likely related to viral illness as above, however considered secondary to recent antibiotic course from dentist (procedural), unsure exact name, not located within chart. Suspect could be due to loss of normal gut flora, he is on probiotic still. Not clinically consistent with C Diff. - Reassurance, try Bentyl and OTC Loperamide, hydration, avoid provoking dietary foods - Return criteria given if significant worsening and any consideration of C Diff, again less likely      Meds ordered this encounter  Medications  . dicyclomine (BENTYL) 10 MG capsule    Sig: Take 1 capsule (10 mg total) by mouth 4 (four) times daily -  before meals and at bedtime.    Dispense:  30 capsule    Refill:  0      Follow up plan: Return for Annual Physical.   Will order future labs for upcoming annual physical after  01/05/17  Saralyn Pilar, DO [facility] 09/28/2016, 9:11 AM

## 2017-01-08 ENCOUNTER — Telehealth: Payer: Self-pay | Admitting: Family Medicine

## 2017-01-08 DIAGNOSIS — E786 Lipoprotein deficiency: Secondary | ICD-10-CM

## 2017-01-08 DIAGNOSIS — Z0000 Encounter for general adult medical examination without abnormal findings: Secondary | ICD-10-CM

## 2017-01-08 DIAGNOSIS — R7309 Other abnormal glucose: Secondary | ICD-10-CM | POA: Insufficient documentation

## 2017-01-08 DIAGNOSIS — E042 Nontoxic multinodular goiter: Secondary | ICD-10-CM

## 2017-01-08 MED ORDER — LEVOTHYROXINE SODIUM 200 MCG PO TABS: 200.0000 ug | ORAL_TABLET | Freq: Every day | ORAL | 0 refills | Status: DC

## 2017-01-08 NOTE — Telephone Encounter (Signed)
Refilled Synthroid 200mcg daily #30 pills [facility] [place]  Dis[facility], now [facility], future orders placed CMET, Lipids, A1c, TSH, and then released to active orders for 1 week for [facility].  Follow-up as scheduled Annual Physical end of 01/2017  Saralyn PilarAlexander Karamalegos, DO [facility] 01/08/2017, 12:42 PM

## 2017-01-08 NOTE — Telephone Encounter (Signed)
Pt needs a 30 day refill for synthroid, and [facility]rox[facility] [place].  He is going to [facility] to do labs next week and needs an order.  His call back number is [phone] or [phone]

## 2017-01-11 ENCOUNTER — Other Ambulatory Visit: Payer: BC Managed Care – PPO

## 2017-01-12 LAB — COMPREHENSIVE METABOLIC PANEL
A/G RATIO: 1.4 (ref 1.2–2.2)
ALT: 31 IU/L (ref 0–44)
AST: 23 IU/L (ref 0–40)
Albumin: 4.2 g/dL (ref 3.5–5.5)
Alkaline Phosphatase: 83 IU/L (ref 39–117)
BILIRUBIN TOTAL: 0.4 mg/dL (ref 0.0–1.2)
BUN / CREAT RATIO: 12 (ref 9–20)
BUN: 11 mg/dL (ref 6–24)
CHLORIDE: 103 mmol/L (ref 96–106)
CO2: 25 mmol/L (ref 18–29)
Calcium: 9.1 mg/dL (ref 8.7–10.2)
Creatinine, Ser: 0.92 mg/dL (ref 0.76–1.27)
GFR calc non Af Amer: 97 mL/min/{1.73_m2} (ref >59)
GFR, EST AFRICAN AMERICAN: 113 mL/min/{1.73_m2} (ref >59)
Globulin, Total: 2.9 g/dL (ref 1.5–4.5)
Glucose: 106 mg/dL — ABNORMAL HIGH (ref 65–99)
POTASSIUM: 4.3 mmol/L (ref 3.5–5.2)
Sodium: 140 mmol/L (ref 134–144)
Total Protein: 7.1 g/dL (ref 6.0–8.5)

## 2017-01-12 LAB — TSH: TSH: 2.41 u[IU]/mL (ref 0.450–4.500)

## 2017-01-12 LAB — LIPID PANEL
Chol/HDL Ratio: 3.8 ratio (ref 0.0–5.0)
Cholesterol, Total: 133 mg/dL (ref 100–199)
HDL: 35 mg/dL — ABNORMAL LOW (ref >39)
LDL CALC: 83 mg/dL (ref 0–99)
Triglycerides: 76 mg/dL (ref 0–149)
VLDL CHOLESTEROL CAL: 15 mg/dL (ref 5–40)

## 2017-01-12 LAB — HEMOGLOBIN A1C
Est. average glucose Bld gHb Est-mCnc: 97 mg/dL
Hgb A1c MFr Bld: 5 % (ref 4.8–5.6)

## 2017-01-12 MED ORDER — LEVOTHYROXINE SODIUM 200 MCG PO TABS: 200.0000 ug | ORAL_TABLET | Freq: Every day | ORAL | 3 refills | Status: DC

## 2017-01-12 NOTE — Addendum Note (Signed)
Addended by: Smitty CordsKARAMALEGOS, Artyom Stencel J on: 01/12/2017 06:39 AM   Modules accepted: Orders

## 2017-01-19 ENCOUNTER — Encounter: Payer: Self-pay | Admitting: Emergency Medicine

## 2017-01-19 ENCOUNTER — Ambulatory Visit
Admission: EM | Admit: 2017-01-19 | Discharge: 2017-01-19 | Disposition: A | Discharge status: Home / Self Care | Payer: BC Managed Care – PPO | Attending: Emergency Medicine | Admitting: Emergency Medicine

## 2017-01-19 DIAGNOSIS — M71121 Other infective bursitis, right elbow: Secondary | ICD-10-CM | POA: Diagnosis not present

## 2017-01-19 MED ORDER — MUPIROCIN 2 % EX OINT: 1.0000 "application " | TOPICAL_OINTMENT | Freq: Three times a day (TID) | CUTANEOUS | 0 refills | Status: DC

## 2017-01-19 MED ORDER — SULFAMETHOXAZOLE-TRIMETHOPRIM 800-160 MG PO TABS: 1.0000 | ORAL_TABLET | Freq: Two times a day (BID) | ORAL | 0 refills | Status: DC

## 2017-01-19 MED ORDER — NAPROXEN 500 MG PO TABS: 500.0000 mg | ORAL_TABLET | Freq: Two times a day (BID) | ORAL | 0 refills | Status: DC

## 2017-01-19 NOTE — ED Provider Notes (Signed)
CSN: [id number]     Arrival date & time 01/19/17  1446 History   First MD Initiated Contact with Patient 01/19/17 1607     Chief Complaint  Patient presents with  . Elbow Pain    right   (Consider location/radiation/quality/duration/timing/severity/associated sxs/prior Treatment) HPI  Is a 51 year old male who presents with right elbow pain redness and warmth that started yesterday. Patient hit his elbow 2 days prior to noticing the pain. Since that time he has had increasing redness pain and warmth. Any pressure on it is very painful. He has a good range of motion of his elbow is not affected. He denies any fever chills or night sweats. His had no drainage from the elbow.       Past Medical History:  Diagnosis Date  . Thyroid disease    Past Surgical History:  Procedure Laterality Date  . SHOULDER SURGERY Right    Family History  Problem Relation Age of Onset  . COPD Mother   . Atrial fibrillation Father    Social History  Substance Use Topics  . Smoking status: Never Smoker  . Smokeless tobacco: Never Used  . Alcohol use No    Review of Systems  Constitutional: Positive for activity change. Negative for chills, fatigue and fever.  Musculoskeletal: Positive for arthralgias and joint swelling.  Skin: Positive for wound.  All other systems reviewed and are negative.   Allergies  Patient has no known allergies.  Home Medications   Prior to Admission medications   Medication Sig Start Date End Date Taking? Authorizing Provider  dicyclomine (BENTYL) 10 MG capsule Take 1 capsule (10 mg total) by mouth 4 (four) times daily -  before meals and at bedtime. 09/28/16   Karamalegos, Netta NeatAlexander J, DO  levothyroxine (SYNTHROID, LEVOTHROID) 200 MCG tablet Take 1 tablet (200 mcg total) by mouth daily before breakfast. 01/12/17   Karamalegos, Netta NeatAlexander J, DO  mupirocin ointment (BACTROBAN) 2 % Apply 1 application topically 3 (three) times daily. 01/19/17   Lutricia Feiloemer, William P, PA-C    naproxen (NAPROSYN) 500 MG tablet Take 1 tablet (500 mg total) by mouth 2 (two) times daily with a meal. 01/19/17   Lutricia Feiloemer, William P, PA-C  sulfamethoxazole-trimethoprim (BACTRIM DS,SEPTRA DS) 800-160 MG tablet Take 1 tablet by mouth 2 (two) times daily. 01/19/17   Lutricia Feiloemer, William P, PA-C   Meds Ordered and Administered this Visit  Medications - No data to display  BP 126/89 (BP Location: Left Arm)   Pulse 85   Temp 98.1 F (36.7 C) (Oral)   Resp 16   Ht 5\' 11"  (1.803 m)   Wt 220 lb (99.8 kg)   SpO2 100%   BMI 30.68 kg/m  No data found.   Physical Exam  Constitutional: He is oriented to person, place, and time. He appears well-developed and well-nourished. No distress.  HENT:  Head: Normocephalic.  Eyes: Pupils are equal, round, and reactive to light.  Neck: Normal range of motion.  Musculoskeletal: Normal range of motion. He exhibits edema and tenderness.  Examination of the right elbow shows full range of motion flexion and extension pronation and supination. The olecranon is a large effusion that is erythematous warm and very tender to the touch. Is a small eschar/abrasion just distal to the area of erythema. There is no drainage from the eschar.  Neurological: He is alert and oriented to person, place, and time.  Skin: Skin is warm and dry. He is not diaphoretic.  Nursing note and vitals reviewed.  Urgent Care Course     Procedures (including critical care time)  Labs Review Labs Reviewed - No data to display  Imaging Review No results found.   Visual Acuity Review  Right Eye Distance:   Left Eye Distance:   Bilateral Distance:    Right Eye Near:   Left Eye Near:    Bilateral Near:         MDM   1. Septic olecranon bursitis of right elbow    Discharge Medication List as of 01/19/2017  4:25 PM    START taking these medications   Details  mupirocin ointment (BACTROBAN) 2 % Apply 1 application topically 3 (three) times daily., Starting Fri  01/19/2017, Normal    sulfamethoxazole-trimethoprim (BACTRIM DS,SEPTRA DS) 800-160 MG tablet Take 1 tablet by mouth 2 (two) times daily., Starting Fri 01/19/2017, Normal      Plan: 1. Test/x-ray results and diagnosis reviewed with patient 2. rx as per orders; risks, benefits, potential side effects reviewed with patient 3. Recommend supportive treatment with Prevention of pressure on the area. I recommended that he consider using basketball elbow pad if necessary . will start him on Septra DS and Bactroban ointment 3 times daily. If he worsens he should follow-up in the emergency room or go see his orthopedic surgeon at [facility] in [facility] . 4. F/u prn if symptoms worsen or don't improve     Lutricia Feil, PA-C 01/19/17 1636

## 2017-01-19 NOTE — ED Triage Notes (Signed)
Patient c/o right elbow pain, redness and warmth that started yesterday.

## 2017-02-01 ENCOUNTER — Encounter: Payer: Self-pay | Admitting: Family Medicine

## 2017-02-01 ENCOUNTER — Ambulatory Visit (INDEPENDENT_AMBULATORY_CARE_PROVIDER_SITE_OTHER): Payer: BC Managed Care – PPO | Admitting: Family Medicine

## 2017-02-01 VITALS — BP 124/87 | HR 78 | Temp 97.7°F | Resp 16 | Ht 71.0 in | Wt 218.0 lb

## 2017-02-01 DIAGNOSIS — Z0000 Encounter for general adult medical examination without abnormal findings: Secondary | ICD-10-CM

## 2017-02-01 DIAGNOSIS — E042 Nontoxic multinodular goiter: Secondary | ICD-10-CM

## 2017-02-01 DIAGNOSIS — E786 Lipoprotein deficiency: Secondary | ICD-10-CM

## 2017-02-01 DIAGNOSIS — L03113 Cellulitis of right upper limb: Secondary | ICD-10-CM

## 2017-02-01 DIAGNOSIS — M25521 Pain in right elbow: Secondary | ICD-10-CM

## 2017-02-01 DIAGNOSIS — R7309 Other abnormal glucose: Secondary | ICD-10-CM | POA: Diagnosis not present

## 2017-02-01 NOTE — Progress Notes (Signed)
Subjective:    Patient ID: Tania AdeBruce D Reichart Jr., male    DOB: 01/20/1966, 51 y.o.   MRN: [id number]  Tania AdeBruce D Rudnicki Jr. is a 51 y.o. male presenting on 02/01/2017 for Annual Exam (as per pt Right elbow swollen onset 2 weeks ago and went to urgent care was Rx antibiotics not helping )  HPI   LOW HDL / History of Abnormal Glucose / OBESITY BMI >30 / Lifestyle - He reports that he is down 3-5 lbs in past 4 months, trying to improve his lifestyle. Set back with his R elbow. Joined gym plan to do more regular exercise, in past he used to exercise more regularly, had not for a while now. Tries to eat healthier options, but still admits to eating wrong foods at times. Not following any particular diet. - Never on cholesterol medication in past - prior history of mild abnormal blood sugar, did not have dx Pre-DM in past. Recent A1c normal range.  Hypothyroidism / History of Multinodular Goiter - Chronic problem. No new concerns. Last TSH in normal range - he continues to take Levothyroxine 200mcg daily   RIGHT ELBOW PAIN, Olecranon bursitis with Cellulitis: - Reports new concern recently for past 4 weeks with R elbow pain, swelling, and redness initially, he had an abrasion or laceration unclear how he obtained this injury, it healed some but developed worsening symptoms as above. He [facility] 2 weeks later [facility]. He was given topical bactroban antibiotic and oral Bactrim-DS for antibiotic coverage for 2 weeks. Treatments have improved his symptoms, but still has persistent pain now less redness, pain is only when actively using R elbow or leaning on it putting direct pressure. Urgent care advised him to go back to Orthopedics for re-evaluation. - He is requesting new referral to return to [facility]. Previously followed by [facility] about 2 years ago for knee pain - He also took NSAID. Finished naproxen course, now only using as needed - Denies any  fever/chills, sweats, spreading redness other joint involvement  Health Maintenance: - Reported TDap 5 years ago in May 2013 at Urgent Care, he declines repeat booster today - Due for routine HIV screen, declined, he will consider this in future - No known family history of Colon Cancer or Prostate Cancer. Will agree to PSA next year and is considering Cologuard in future.   Past Medical History:  Diagnosis Date  . Thyroid disease    Past Surgical History:  Procedure Laterality Date  . SHOULDER SURGERY Right    Social History   Social History  . Marital status: Married    Spouse name: N/A  . Number of children: N/A  . Years of education: N/A   Occupational History  . Not on file.   Social History Main Topics  . Smoking status: Never Smoker  . Smokeless tobacco: Never Used  . Alcohol use No  . Drug use: No  . Sexual activity: Not on file   Other Topics Concern  . Not on file   Social History Narrative  . No narrative on file   Family History  Problem Relation Age of Onset  . COPD Mother   . Atrial fibrillation Father    Current Outpatient Prescriptions on File Prior to Visit  Medication Sig  . levothyroxine (SYNTHROID, LEVOTHROID) 200 MCG tablet Take 1 tablet (200 mcg total) by mouth daily before breakfast.  . naproxen (NAPROSYN) 500 MG tablet Take 1 tablet (500 mg total) by  mouth 2 (two) times daily with a meal.   No current facility-administered medications on file prior to visit.     Review of Systems  Constitutional: Negative for activity change, appetite change, chills, diaphoresis, fatigue and fever.  HENT: Negative for congestion, hearing loss and sinus pressure.   Eyes: Negative for visual disturbance.  Respiratory: Negative for cough, chest tightness, shortness of breath and wheezing.   Cardiovascular: Negative for chest pain, palpitations and leg swelling.  Gastrointestinal: Negative for abdominal pain, anal bleeding, blood in stool, constipation,  diarrhea, nausea and vomiting.  Endocrine: Negative for cold intolerance and polyuria.  Genitourinary: Negative for decreased urine volume, difficulty urinating, dysuria, frequency and hematuria.  Musculoskeletal: Positive for arthralgias (Right elbow pain) and joint swelling (R elbow). Negative for back pain and neck pain.  Skin: Negative for rash.  Allergic/Immunologic: Negative for environmental allergies.  Neurological: Negative for dizziness, weakness, light-headedness, numbness and headaches.  Hematological: Negative for adenopathy.  Psychiatric/Behavioral: Negative for behavioral problems, dysphoric mood and sleep disturbance. The patient is not nervous/anxious.    Per HPI unless specifically indicated above     Objective:    BP 124/87   Pulse 78   Temp 97.7 F (36.5 C) (Oral)   Resp 16   Ht 5\' 11"  (1.803 m)   Wt 218 lb (98.9 kg)   BMI 30.40 kg/m   Wt Readings from Last 3 Encounters:  02/01/17 218 lb (98.9 kg)  01/19/17 220 lb (99.8 kg)  09/28/16 221 lb (100.2 kg)    Physical Exam  Constitutional: He is oriented to person, place, and time. He appears well-developed and well-nourished. No distress.  Well-appearing, comfortable, cooperative, muscular build  HENT:  Head: Normocephalic and atraumatic.  Mouth/Throat: Oropharynx is clear and moist.  Frontal / maxillary sinuses non-tender. Nares patent without purulence or edema. Bilateral TMs clear without erythema, effusion or bulging. Oropharynx clear without erythema, exudates, edema or asymmetry.  Eyes: Conjunctivae and EOM are normal. Pupils are equal, round, and reactive to light.  Neck: Normal range of motion. Neck supple. No thyromegaly present.  No carotid bruits  Cardiovascular: Normal rate, regular rhythm, normal heart sounds and intact distal pulses.   No murmur heard. Pulmonary/Chest: Effort normal and breath sounds normal. No respiratory distress. He has no wheezes. He has no rales.  Abdominal: Soft. Bowel  sounds are normal. He exhibits no distension and no mass. There is no tenderness.  Musculoskeletal: Normal range of motion. He exhibits no edema.  Right Elbow Inspection / Palpation: mild erythema over elbow, with slightly bulky prominence, with warmth, and tenderness over olecranon, some mild edema without obvious joint effusion ROM: Mostly full range of motion with R elbow flex/extension Strength: Preserved 5/5 intact tricep, bicep, wrist, shoulder Neurovascular: distally intact  Lymphadenopathy:    He has no cervical adenopathy.  Neurological: He is alert and oriented to person, place, and time.  Skin: Skin is warm and dry. No rash noted. He is not diaphoretic.  Psychiatric: He has a normal mood and affect. His behavior is normal.  Well groomed, good eye contact, normal speech and thoughts  Nursing note and vitals reviewed.    Results for orders placed or performed in visit on 01/08/17  Comprehensive metabolic panel  Result Value Ref Range   Glucose 106 (H) 65 - 99 mg/dL   BUN 11 6 - 24 mg/dL   Creatinine, Ser 4.09 0.76 - 1.27 mg/dL   GFR calc non Af Amer 97 >59 mL/min/1.73   GFR calc Af  Amer 113 >59 mL/min/1.73   BUN/Creatinine Ratio 12 9 - 20   Sodium 140 134 - 144 mmol/L   Potassium 4.3 3.5 - 5.2 mmol/L   Chloride 103 96 - 106 mmol/L   CO2 25 18 - 29 mmol/L   Calcium 9.1 8.7 - 10.2 mg/dL   Total Protein 7.1 6.0 - 8.5 g/dL   Albumin 4.2 3.5 - 5.5 g/dL   Globulin, Total 2.9 1.5 - 4.5 g/dL   Albumin/Globulin Ratio 1.4 1.2 - 2.2   Bilirubin Total 0.4 0.0 - 1.2 mg/dL   Alkaline Phosphatase 83 39 - 117 IU/L   AST 23 0 - 40 IU/L   ALT 31 0 - 44 IU/L  TSH  Result Value Ref Range   TSH 2.410 0.450 - 4.500 uIU/mL  Hemoglobin A1c  Result Value Ref Range   Hgb A1c MFr Bld 5.0 4.8 - 5.6 %   Est. average glucose Bld gHb Est-mCnc 97 mg/dL  Lipid panel  Result Value Ref Range   Cholesterol, Total 133 100 - 199 mg/dL   Triglycerides 76 0 - 149 mg/dL   HDL 35 (L) >69 mg/dL    VLDL Cholesterol Cal 15 5 - 40 mg/dL   LDL Calculated 83 0 - 99 mg/dL   Chol/HDL Ratio 3.8 0.0 - 5.0 ratio      Assessment & Plan:   Problem List Items Addressed This Visit    Multinodular goiter    Stable, hypothyroid, controlled on current thyroid hormone Last TSH 2.4 (01/2017) Continue current Levothyroxine daily, has refills Check TSH yearly      Low HDL (under 40)    Mild low HDL but improved since last visit now 35 No prior cholesterol therapy ASCVD risk 2.6% in 10 year, does not meet criteria for statin Encouraged improve diet and exercise, now resume gym to improve HDL Follow-up yearly physical for fasting lipids      Abnormal glucose    Normal A1c now 5.0 Risk with obesity BMI >30, mild low HDL, otherwise limited risk factors Encouraged improve diet and lifestyle, start back regular exercise Check A1c yearly for screening       Other Visit Diagnoses    Annual physical exam    -  Primary   Right elbow pain       Relevant Orders   Ambulatory referral to Orthopedic Surgery   Cellulitis of right elbow       Relevant Orders   Ambulatory referral to Orthopedic Surgery  Clinically consistent with R elbow bursitis, with possible complicated cellulitis earlier after abrasion, not clear from history if joint involvement, seems to be more localized to bursa, now improved on topical and oral antibiotic Bactrim-DS, still residual significant pain to palpation. No trauma, no imaging recently. - Completed NSAIDs - Limited by his physical job due to elbow pain  Plan: 1. Urgent referral back to [facility], re-establish (had been seen for knee in past 2 years ago), now evaluation of R elbow, may need x-ray vs other imaging. 2. RICE therapy 3. No further antibiotics today 4. Follow-up and return criteria given if worsening      No orders of the defined types were placed in this encounter.   Follow up plan: Return in about 1 year (around 02/01/2018) for Annual  Physical.   Advised will need to notify us prior to obtaining labs from [facility] in 1 year for fasting labs. Will add PSA to next year labs.  Saralyn Pilar, DO [facility] 02/01/2017, 2:48 PM

## 2017-02-01 NOTE — Patient Instructions (Addendum)
Thank you for coming to the clinic today.  1. Keep up the good work!  Work on regular exercising at gym  Stay well hydrated.  Try to avoid overuse injury for joints.  Right Elbow Order referral for R elbow pain - if you don't hear back within 1 week call them to see if you can schedule  [facility] ([facility] Address: [address], [place], [place] [address] Hours:  9AM-5PM Phone: [phone]  Colon Cancer Screening: - For all adults age 50+ routine colon cancer screening is highly recommended. - Early detection of colon cancer is important, because often there are no warning signs or symptoms, also if found early usually it can be cured. Late stage is hard to treat.  - If you are not interested in Colonoscopy screening (if done and normal you could be cleared for 5 to 10 years until next due), then Cologuard is an excellent alternative for screening test for Colon Cancer. It is highly sensitive for detecting DNA of colon cancer from even the earliest stages. Also, there is NO bowel prep required. - If Cologuard is NEGATIVE, then it is good for 3 years before next due - If Cologuard is POSITIVE, then it is strongly advised to get a Colonoscopy, which allows the GI doctor to locate the source of the cancer or polyp (even very early stage) and treat it by removing it. ------------------------- If you would like to proceed with Cologuard (stool DNA test) - FIRST, call your insurance company and tell them you want to check cost of Cologuard tell them CPT Code [phone] (it may be completely covered and you could get for no cost, OR max cost without any coverage is about $600). Also, keep in mind if you do NOT open the kit, and decide not to do the test, you will NOT be charged, you should contact the company if you decide not to do the test. - If you want to proceed, you can notify us (phone message, [place], or at next visit) and we will order it for you.  The test kit will be delivered to you house within about 1 week. Follow instructions to collect sample, you may call the company for any help or questions, 24/7 telephone support at [phone].  Please schedule a Follow-up Appointment to: Return in about 1 year (around 02/01/2018) for Annual Physical.  If you have any other questions or concerns, please feel free to call the clinic or send a message through [place]. You may also schedule an earlier appointment if necessary.  Nobie Putnam, DO [place]

## 2017-02-01 NOTE — Assessment & Plan Note (Addendum)
Stable, hypothyroid, controlled on current thyroid hormone Last TSH 2.4 (01/2017) Continue current Levothyroxine 200mcg daily, has refills Check TSH yearly

## 2017-02-01 NOTE — Assessment & Plan Note (Signed)
Normal A1c now 5.0 Risk with obesity BMI >30, mild low HDL, otherwise limited risk factors Encouraged improve diet and lifestyle, start back regular exercise Check A1c yearly for screening

## 2017-02-01 NOTE — Assessment & Plan Note (Signed)
Mild low HDL but improved since last visit now 35 No prior cholesterol therapy ASCVD risk 2.6% in 10 year, does not meet criteria for statin Encouraged improve diet and exercise, now resume gym to improve HDL Follow-up yearly physical for fasting lipids

## 2018-01-17 ENCOUNTER — Other Ambulatory Visit: Payer: Self-pay | Admitting: Family Medicine

## 2018-01-17 ENCOUNTER — Ambulatory Visit: Payer: BC Managed Care – PPO | Admitting: Family Medicine

## 2018-01-17 ENCOUNTER — Encounter: Payer: Self-pay | Admitting: Family Medicine

## 2018-01-17 VITALS — BP 139/80 | HR 91 | Temp 98.0°F | Resp 16 | Ht 71.0 in | Wt 222.6 lb

## 2018-01-17 DIAGNOSIS — E042 Nontoxic multinodular goiter: Secondary | ICD-10-CM

## 2018-01-17 DIAGNOSIS — J0100 Acute maxillary sinusitis, unspecified: Secondary | ICD-10-CM | POA: Diagnosis not present

## 2018-01-17 DIAGNOSIS — E786 Lipoprotein deficiency: Secondary | ICD-10-CM

## 2018-01-17 DIAGNOSIS — R7309 Other abnormal glucose: Secondary | ICD-10-CM

## 2018-01-17 DIAGNOSIS — Z0000 Encounter for general adult medical examination without abnormal findings: Secondary | ICD-10-CM

## 2018-01-17 DIAGNOSIS — Z125 Encounter for screening for malignant neoplasm of prostate: Secondary | ICD-10-CM

## 2018-01-17 MED ORDER — BENZONATATE 100 MG PO CAPS: 100.0000 mg | ORAL_CAPSULE | Freq: Three times a day (TID) | ORAL | 0 refills | Status: DC | PRN

## 2018-01-17 MED ORDER — AMOXICILLIN-POT CLAVULANATE 875-125 MG PO TABS
1.0000 | ORAL_TABLET | Freq: Two times a day (BID) | ORAL | 0 refills | Status: DC
Start: 2018-01-17 — End: 2018-02-11

## 2018-01-17 NOTE — Patient Instructions (Addendum)
Thank you for coming to the office today.  1. It sounds like you have a Sinusitis (Bacterial Infection) - this most likely started as an Upper Respiratory Virus that has settled into an infection. Allergies can also cause this. - Start Augmentin 1 pill twice daily (breakfast and dinner, with food and plenty of water) for 10 days, complete entire course, do not stop early even if feeling better - Resume Loratadine (Claritin)  daily and Flonase 2 sprays in each nostril daily for next 4-6 weeks, then you may stop and use seasonally or as needed  Start Tessalon Perls take 1 capsule up to 3 times a day as needed for cough  Finish mucinex by this weekend  Can take OTC Pseudophedrine (behind the counter) - or it is in the mucinex sinus as well. - it is better than phenylephrine  - Recommend to start keep using Nasal Saline spray multiple times a day to help flush out congestion and clear sinuses  - Improve hydration by drinking plenty of clear fluids (water, gatorade) to reduce secretions and thin congestion  - Congestion draining down throat can cause irritation. May try warm herbal tea with honey, cough drops  - Can take Tylenol or Ibuprofen as needed for fevers ----------------------------------------  Take claritin in feb or early march to help reduce symptoms - and keep flonase going through feb through june   If you develop persistent fever >101F for at least 3 consecutive days, headaches with sinus pain or pressure or persistent earache, please schedule a follow-up evaluation within next few days to week.  [facility] printed orders  DUE for FASTING BLOOD WORK (no food or drink after midnight before the lab appointment, only water or coffee without cream/sugar on the morning of)   Please schedule a Follow-up Appointment to: Return in about 1 month (around 02/14/2018) for Annual Physical.  If you have any other questions or concerns, please feel free to call the office or send a  message through MyChart. You may also schedule an earlier appointment if necessary.  Additionally, you may be receiving a survey about your experience at our office within a few days to 1 week by e-mail or mail. We value your feedback.  Saralyn Pilar, DO [facility], [place]

## 2018-01-17 NOTE — Progress Notes (Signed)
Subjective:    Patient ID: Luis Ade., male    DOB: 23-Apr-1966, 52 y.o.   MRN: [id number]  Luis Holt. is a 52 y.o. male presenting on 01/17/2018 for Sinusitis (onset week facial pressure Mucinex OTC not improving cough, night gets worst, sore throat at begining but it's gone as per patient)   HPI   Acute Sinusitis Reports onset >1 week ago with sinus congestion and pressure. No sick contact. Persistent symptoms, now worsening symptoms especially at night. Pain and pressure and congestion. Waking up to thicker phelgm, some cough. Tried Mucinex, Theraflu, honey moonshine (for throat). Mucinex breaks up phlegm but still clearing a lot out. Now pressure and pain worse Previously did not have allergy symptoms when in [place] - now worse in [place]. He takes Loratadine and Flonase OTC but started more recently not before allergy exposure Admits sinus pressure Denies fevers, chills, nausea vomiting abdominal pain, ear pain   Depression screen PHQ 2/9 02/01/2017  Decreased Interest 0  Down, Depressed, Hopeless 0  PHQ - 2 Score 0    Social History   Tobacco Use  . Smoking status: Never Smoker  . Smokeless tobacco: Never Used  Substance Use Topics  . Alcohol use: No  . Drug use: No    Review of Systems Per HPI unless specifically indicated above     Objective:    BP 139/80   Pulse 91   Temp 98 F (36.7 C) (Oral)   Resp 16   Ht  (1.803 m)   Wt 222 lb 9.6 oz (101 kg)   SpO2 99%   BMI 31.05 kg/m   Wt Readings from Last 3 Encounters:  01/17/18 222 lb 9.6 oz (101 kg)  02/01/17 218 lb (98.9 kg)  01/19/17 220 lb (99.8 kg)    Physical Exam  Constitutional: He is oriented to person, place, and time. He appears well-developed and well-nourished. No distress.  Mildly ill and tired appearing, cooperative  HENT:  Head: Normocephalic and atraumatic.  Mouth/Throat: Oropharynx is clear and moist.  Maxillary sinuses mild tender. Nares with some turbinate edema and  congestion w/o purulence. Bilateral TMs clear without erythema, effusion or bulging. Oropharynx clear without erythema, exudates, edema or asymmetry.  Eyes: Conjunctivae are normal. Right eye exhibits no discharge. Left eye exhibits no discharge.  Neck: Normal range of motion. Neck supple.  Cardiovascular: Normal rate, regular rhythm, normal heart sounds and intact distal pulses.  No murmur heard. Pulmonary/Chest: Effort normal and breath sounds normal. No respiratory distress. He has no wheezes. He has no rales.  Musculoskeletal: Normal range of motion. He exhibits no edema.  Lymphadenopathy:    He has no cervical adenopathy.  Neurological: He is alert and oriented to person, place, and time.  Skin: Skin is warm and dry. No rash noted. He is not diaphoretic. No erythema.  Psychiatric: He has a normal mood and affect. His behavior is normal.  Well groomed, good eye contact, normal speech and thoughts  Nursing note and vitals reviewed.  Results for orders placed or performed in visit on 01/08/17  Comprehensive metabolic panel  Result Value Ref Range   Glucose 106 (H) 65 - 99 mg/dL   BUN 11 6 - 24 mg/dL   Creatinine, Ser 4.09 0.76 - 1.27 mg/dL   GFR calc non Af Amer 97 >59 mL/min/1.73   GFR calc Af Amer 113 >59 mL/min/1.73   BUN/Creatinine Ratio 12 9 - 20   Sodium 140 134 - 144 mmol/L  Potassium 4.3 3.5 - 5.2 mmol/L   Chloride 103 96 - 106 mmol/L   CO2 25 18 - 29 mmol/L   Calcium 9.1 8.7 - 10.2 mg/dL   Total Protein 7.1 6.0 - 8.5 g/dL   Albumin 4.2 3.5 - 5.5 g/dL   Globulin, Total 2.9 1.5 - 4.5 g/dL   Albumin/Globulin Ratio 1.4 1.2 - 2.2   Bilirubin Total 0.4 0.0 - 1.2 mg/dL   Alkaline Phosphatase 83 39 - 117 IU/L   AST 23 0 - 40 IU/L   ALT 31 0 - 44 IU/L  TSH  Result Value Ref Range   TSH 2.410 0.450 - 4.500 uIU/mL  Hemoglobin A1c  Result Value Ref Range   Hgb A1c MFr Bld 5.0 4.8 - 5.6 %   Est. average glucose Bld gHb Est-mCnc 97 mg/dL  Lipid panel  Result Value Ref Range    Cholesterol, Total 133 100 - 199 mg/dL   Triglycerides 76 0 - 149 mg/dL   HDL 35 (L) >21 mg/dL   VLDL Cholesterol Cal 15 5 - 40 mg/dL   LDL Calculated 83 0 - 99 mg/dL   Chol/HDL Ratio 3.8 0.0 - 5.0 ratio      Assessment & Plan:   Problem List Items Addressed This Visit    None    Visit Diagnoses    Acute non-recurrent maxillary sinusitis    -  Primary   Relevant Medications   fluticasone (FLONASE) 50 MCG/ACT nasal spray   amoxicillin-clavulanate (AUGMENTIN) 875-125 MG tablet   benzonatate (TESSALON) 100 MG capsule      Consistent with acute maxillary sinusitis, likely initially viral URI vs allergic rhinitis component with worsening concern for bacterial infection, now second sickening and sinus pain w/ pressure.  Plan: 1. Start Augmentin 875-125mg  PO BID x 10 days 2. Resume Loratadine (Claritin)  daily and Flonase 2 sprays in each nostril daily for next 4-6 weeks, then may stop and use seasonally or as needed - in future START in Feb or earlier in Spring to prevent problem - Start Tessalon Perls take 1 capsule up to 3 times a day as needed for cough - Use OTC decongestant as discussed per AVS - Finish mucinex this weekend 3. Supportive care with nasal saline OTC, hydration 4. Return criteria reviewed   Meds ordered this encounter  Medications  . amoxicillin-clavulanate (AUGMENTIN) 875-125 MG tablet    Sig: Take 1 tablet by mouth 2 (two) times daily. For 10 days    Dispense:  20 tablet    Refill:  0  . benzonatate (TESSALON) 100 MG capsule    Sig: Take 1 capsule (100 mg total) by mouth 3 (three) times daily as needed for cough.    Dispense:  30 capsule    Refill:  0    Follow up plan: Return in about 1 month (around 02/14/2018) for Annual Physical.  Future labs ordered for [facility] - printed given to patient, draw in 2 weeks.  Saralyn Pilar, DO [facility] 01/17/2018, 11:02 AM

## 2018-02-11 ENCOUNTER — Ambulatory Visit: Payer: BC Managed Care – PPO | Admitting: Family Medicine

## 2018-02-11 ENCOUNTER — Encounter: Payer: Self-pay | Admitting: Family Medicine

## 2018-02-11 VITALS — BP 146/97 | HR 92 | Resp 16 | Ht 71.0 in | Wt 222.0 lb

## 2018-02-11 DIAGNOSIS — L03818 Cellulitis of other sites: Secondary | ICD-10-CM | POA: Diagnosis not present

## 2018-02-11 DIAGNOSIS — S83249A Other tear of medial meniscus, current injury, unspecified knee, initial encounter: Secondary | ICD-10-CM | POA: Insufficient documentation

## 2018-02-11 DIAGNOSIS — T63301A Toxic effect of unspecified spider venom, accidental (unintentional), initial encounter: Secondary | ICD-10-CM | POA: Diagnosis not present

## 2018-02-11 MED ORDER — DOXYCYCLINE HYCLATE 100 MG PO TABS: 100.0000 mg | ORAL_TABLET | Freq: Two times a day (BID) | ORAL | 0 refills | Status: DC

## 2018-02-11 MED ORDER — MUPIROCIN 2 % EX OINT: 1.0000 "application " | TOPICAL_OINTMENT | Freq: Two times a day (BID) | CUTANEOUS | 0 refills | Status: DC

## 2018-02-11 NOTE — Patient Instructions (Addendum)
Thank you for coming to the office today.  Start Doxycycline antibiotic twice daily for 10 days  Refill Mupirocin ointment  If not improving then notify office within 48-72 hours if worsening pain redness drainage fever - we can add another antibiotic to your regimen  Please schedule a Follow-up Appointment to: Return if symptoms worsen or fail to improve, for Spider bite.  If you have any other questions or concerns, please feel free to call the office or send a message through MyChart. You may also schedule an earlier appointment if necessary.  Additionally, you may be receiving a survey about your experience at our office within a few days to 1 week by e-mail or mail. We value your feedback.  Saralyn PilarAlexander Karamalegos, DO [facility], [place]

## 2018-02-11 NOTE — Progress Notes (Signed)
Subjective:    Patient ID: Luis Ade., male    DOB: [birth year], 52 y.o.   MRN: [id number]  Luis Holt. is a 52 y.o. male presenting on 02/11/2018 for Insect Bite (Upper Back happened 1 week ago. )  Patient presents for a same day appointment.  HPI   Acute Spider Bite / Cellulitis Reports new acute problem about 1 week ago he believes developed spider bite, he felt sharp pain in upper back and noticed a bite, but could not locate a spider or other cause of injury. Within next few days he developed red sore bite and surrounding area was red, tender to touch. He thinks brown recluse spider bite, he looked up pictures and says he sent to e-visit doctor. He was advised to come in for antibiotics - He tried previous rx topical mupirocin that he had from previous infection - No prior spider bite similar before - Denies fever, chills, sweats, pain or redness elsewhere on skin, rash, ulceration or bleeding or drainage   Depression screen [facility] 2/9 02/11/2018 02/01/2017  Decreased Interest 0 0  Down, Depressed, Hopeless 0 0  PHQ - 2 Score 0 0    Social History   Tobacco Use  . Smoking status: Never Smoker  . Smokeless tobacco: Never Used  Substance Use Topics  . Alcohol use: No  . Drug use: No    Review of Systems Per HPI unless specifically indicated above     Objective:    BP (!) 146/97   Pulse 92   Resp 16   Ht 5\' 11"  (1.803 m)   Wt 222 lb (100.7 kg)   SpO2 100%   BMI 30.96 kg/m   Wt Readings from Last 3 Encounters:  02/11/18 222 lb (100.7 kg)  01/17/18 222 lb 9.6 oz (101 kg)  02/01/17 218 lb (98.9 kg)    Physical Exam  Constitutional: He is oriented to person, place, and time. He appears well-developed and well-nourished. No distress.  Well-appearing, comfortable, cooperative  HENT:  Head: Normocephalic and atraumatic.  Mouth/Throat: Oropharynx is clear and moist.  Eyes: Conjunctivae are normal. Right eye exhibits no discharge. Left eye exhibits no  discharge.  Cardiovascular: Normal rate.  Pulmonary/Chest: Effort normal.  Musculoskeletal: He exhibits no edema.  Neurological: He is alert and oriented to person, place, and time.  Skin: Skin is warm and dry. Rash noted. He is not diaphoretic. There is erythema.  Localized area upper back left side over trapezius region approx 6 x 6 cm region of localized erythema with some irregular border seems to mimic an erythema migrans but seems more erythema with central bite or wound without open ulceration, mild tender, slight warmth, no abscess or drainage  Psychiatric: He has a normal mood and affect. His behavior is normal.  Well groomed, good eye contact, normal speech and thoughts  Nursing note and vitals reviewed.  Results for orders placed or performed in visit on 01/08/17  Comprehensive metabolic panel  Result Value Ref Range   Glucose 106 (H) 65 - 99 mg/dL   BUN 11 6 - 24 mg/dL   Creatinine, Ser 1.19 0.76 - 1.27 mg/dL   GFR calc non Af Amer 97 >59 mL/min/1.73   GFR calc Af Amer 113 >59 mL/min/1.73   BUN/Creatinine Ratio 12 9 - 20   Sodium 140 134 - 144 mmol/L   Potassium 4.3 3.5 - 5.2 mmol/L   Chloride 103 96 - 106 mmol/L   CO2 25 18 - 29  mmol/L   Calcium 9.1 8.7 - 10.2 mg/dL   Total Protein 7.1 6.0 - 8.5 g/dL   Albumin 4.2 3.5 - 5.5 g/dL   Globulin, Total 2.9 1.5 - 4.5 g/dL   Albumin/Globulin Ratio 1.4 1.2 - 2.2   Bilirubin Total 0.4 0.0 - 1.2 mg/dL   Alkaline Phosphatase 83 39 - 117 IU/L   AST 23 0 - 40 IU/L   ALT 31 0 - 44 IU/L  TSH  Result Value Ref Range   TSH 2.410 0.450 - 4.500 uIU/mL  Hemoglobin A1c  Result Value Ref Range   Hgb A1c MFr Bld 5.0 4.8 - 5.6 %   Est. average glucose Bld gHb Est-mCnc 97 mg/dL  Lipid panel  Result Value Ref Range   Cholesterol, Total 133 100 - 199 mg/dL   Triglycerides 76 0 - 149 mg/dL   HDL 35 (L) >82>39 mg/dL   VLDL Cholesterol Cal 15 5 - 40 mg/dL   LDL Calculated 83 0 - 99 mg/dL   Chol/HDL Ratio 3.8 0.0 - 5.0 ratio        Assessment & Plan:   Problem List Items Addressed This Visit    None    Visit Diagnoses    Spider bite wound, accidental or unintentional, initial encounter    -  Primary   Relevant Medications   doxycycline (VIBRA-TABS) 100 MG tablet   mupirocin ointment (BACTROBAN) 2 %   Cellulitis of other specified site       Relevant Medications   doxycycline (VIBRA-TABS) 100 MG tablet   mupirocin ointment (BACTROBAN) 2 %     Clinically consistent with localized painful erythematous rash appears consistent with spider bite, possibly brown recluse, without any evidence of tissue necrosis but has significant pain and erythema in this area. - Hemodynamic stable, elevated BP today. No other system symptoms  Plan Start Doxycycline 100mg  BID x 10 days, precautions reviewed Refill Mupirocin for topical coverage NSAID PRN Close follow-up if not improving - review strict return criteria, when to seek care at hospital if need    Meds ordered this encounter  Medications  . doxycycline (VIBRA-TABS) 100 MG tablet    Sig: Take 1 tablet (100 mg total) by mouth 2 (two) times daily. For 10 days. Take with full glass of water, stay upright 30 min after taking.    Dispense:  20 tablet    Refill:  0  . mupirocin ointment (BACTROBAN) 2 %    Sig: Apply 1 application topically 2 (two) times daily. 7-10 days    Dispense:  22 g    Refill:  0    Follow up plan: Return if symptoms worsen or fail to improve, for Spider bite.  Saralyn PilarAlexander Karamalegos, DO [facility] 02/11/2018, 9:39 PM

## 2018-03-23 ENCOUNTER — Other Ambulatory Visit: Payer: Self-pay | Admitting: Family Medicine

## 2018-03-23 DIAGNOSIS — E042 Nontoxic multinodular goiter: Secondary | ICD-10-CM

## 2018-04-10 LAB — LIPID PANEL
CHOLESTEROL TOTAL: 151 mg/dL (ref 100–199)
Chol/HDL Ratio: 4.7 ratio (ref 0.0–5.0)
HDL: 32 mg/dL — ABNORMAL LOW (ref >39)
LDL CALC: 78 mg/dL (ref 0–99)
TRIGLYCERIDES: 207 mg/dL — AB (ref 0–149)
VLDL Cholesterol Cal: 41 mg/dL — ABNORMAL HIGH (ref 5–40)

## 2018-04-10 LAB — TSH: TSH: 0.865 u[IU]/mL (ref 0.450–4.500)

## 2018-04-10 LAB — CBC WITH DIFFERENTIAL/PLATELET
BASOS: 1 %
Basophils Absolute: 0 10*3/uL (ref 0.0–0.2)
EOS (ABSOLUTE): 0.2 10*3/uL (ref 0.0–0.4)
EOS: 3 %
HEMATOCRIT: 46.1 % (ref 37.5–51.0)
HEMOGLOBIN: 15.6 g/dL (ref 13.0–17.7)
Immature Grans (Abs): 0 10*3/uL (ref 0.0–0.1)
Immature Granulocytes: 0 %
LYMPHS: 31 %
Lymphocytes Absolute: 2.5 10*3/uL (ref 0.7–3.1)
MCH: 28.1 pg (ref 26.6–33.0)
MCHC: 33.8 g/dL (ref 31.5–35.7)
MCV: 83 fL (ref 79–97)
MONOCYTES: 8 %
Monocytes Absolute: 0.6 10*3/uL (ref 0.1–0.9)
NEUTROS ABS: 4.7 10*3/uL (ref 1.4–7.0)
Neutrophils: 57 %
Platelets: 253 10*3/uL (ref 150–450)
RBC: 5.56 x10E6/uL (ref 4.14–5.80)
RDW: 12.8 % (ref 12.3–15.4)
WBC: 8.1 10*3/uL (ref 3.4–10.8)

## 2018-04-10 LAB — COMPREHENSIVE METABOLIC PANEL
ALT: 38 IU/L (ref 0–44)
AST: 23 IU/L (ref 0–40)
Albumin/Globulin Ratio: 1.5 (ref 1.2–2.2)
Albumin: 4.5 g/dL (ref 3.5–5.5)
Alkaline Phosphatase: 81 IU/L (ref 39–117)
BILIRUBIN TOTAL: 0.3 mg/dL (ref 0.0–1.2)
BUN/Creatinine Ratio: 11 (ref 9–20)
BUN: 10 mg/dL (ref 6–24)
CALCIUM: 9.5 mg/dL (ref 8.7–10.2)
CHLORIDE: 103 mmol/L (ref 96–106)
CO2: 25 mmol/L (ref 20–29)
Creatinine, Ser: 0.89 mg/dL (ref 0.76–1.27)
GFR calc Af Amer: 115 mL/min/{1.73_m2} (ref >59)
GFR calc non Af Amer: 100 mL/min/{1.73_m2} (ref >59)
GLUCOSE: 102 mg/dL — AB (ref 65–99)
Globulin, Total: 3.1 g/dL (ref 1.5–4.5)
Potassium: 4.1 mmol/L (ref 3.5–5.2)
Sodium: 143 mmol/L (ref 134–144)
Total Protein: 7.6 g/dL (ref 6.0–8.5)

## 2018-04-10 LAB — HEMOGLOBIN A1C
Est. average glucose Bld gHb Est-mCnc: 97 mg/dL
Hgb A1c MFr Bld: 5 % (ref 4.8–5.6)

## 2018-04-10 LAB — T4, FREE: Free T4: 1.15 ng/dL (ref 0.82–1.77)

## 2018-04-10 LAB — PSA: PROSTATE SPECIFIC AG, SERUM: 0.9 ng/mL (ref 0.0–4.0)

## 2018-10-31 ENCOUNTER — Other Ambulatory Visit: Payer: Self-pay | Admitting: Family Medicine

## 2018-10-31 DIAGNOSIS — E042 Nontoxic multinodular goiter: Secondary | ICD-10-CM

## 2018-10-31 MED ORDER — SYNTHROID 200 MCG PO TABS
200.0000 ug | ORAL_TABLET | Freq: Every day | ORAL | 1 refills | Status: AC
Start: 2018-10-31 — End: ?

## 2019-07-10 ENCOUNTER — Other Ambulatory Visit: Payer: Self-pay | Admitting: Family Medicine

## 2019-07-10 DIAGNOSIS — E042 Nontoxic multinodular goiter: Secondary | ICD-10-CM

## 2020-04-26 ENCOUNTER — Ambulatory Visit: Payer: Self-pay

## 2020-07-25 ENCOUNTER — Ambulatory Visit
Admission: EM | Admit: 2020-07-25 | Discharge: 2020-07-25 | Disposition: A | Discharge status: Home / Self Care | Payer: BC Managed Care – PPO | Attending: Family Medicine | Admitting: Family Medicine

## 2020-07-25 ENCOUNTER — Encounter: Payer: Self-pay | Admitting: Emergency Medicine

## 2020-07-25 ENCOUNTER — Other Ambulatory Visit: Payer: Self-pay

## 2020-07-25 DIAGNOSIS — M722 Plantar fascial fibromatosis: Secondary | ICD-10-CM | POA: Diagnosis not present

## 2020-07-25 DIAGNOSIS — H00014 Hordeolum externum left upper eyelid: Secondary | ICD-10-CM | POA: Diagnosis not present

## 2020-07-25 MED ORDER — MELOXICAM 15 MG PO TABS: 15.0000 mg | ORAL_TABLET | Freq: Every day | ORAL | 0 refills | Status: AC | PRN

## 2020-07-25 MED ORDER — POLYMYXIN B-TRIMETHOPRIM 10000-0.1 UNIT/ML-% OP SOLN: 1.0000 [drp] | Freq: Four times a day (QID) | OPHTHALMIC | 0 refills | Status: AC

## 2020-07-25 NOTE — ED Provider Notes (Signed)
[facility]    CSN: [id number] Arrival date & time: 07/25/20  1503      History   Chief Complaint Chief Complaint  Patient presents with  . Eye Problem    left   HPI  54 year old male presents with the above complaint.  Patient also reporting left heel pain.  Patient reports swelling and redness of his left eye.  Predominately the left upper eyelid.  Started yesterday.  Denies any drainage from the eye.  He states that he was around a great deal of dust as he is building a house.  No relieving factors.  Pain 5/10 in severity.  Patient also reports ongoing left heel pain for the past month.  No fall, trauma, injury.  Worse in the morning.  No relieving factors.  No other complaints.  Past Medical History:  Diagnosis Date  . Thyroid disease     Patient Active Problem List   Diagnosis Date Noted  . Acute meniscal tear, medial 02/11/2018  . Abnormal glucose 01/08/2017  . Low HDL (under 40) 09/28/2016  . Chronic dermatitis of hands 07/27/2015  . Multinodular goiter 04/10/2013    Past Surgical History:  Procedure Laterality Date  . SHOULDER SURGERY Right        Home Medications    Prior to Admission medications   Medication Sig Start Date End Date Taking? Authorizing Provider  SYNTHROID 200 MCG tablet Take 1 tablet (200 mcg total) by mouth daily before breakfast. 10/31/18  Yes Karamalegos, Netta Neat, DO  diphenhydrAMINE (BENADRYL) 25 MG tablet Take 25 mg by mouth every 6 (six) hours as needed.    [provider]  meloxicam (MOBIC) 15 MG tablet Take 1 tablet (15 mg total) by mouth daily as needed. 07/25/20   Tommie Sams, DO  trimethoprim-polymyxin b (POLYTRIM) ophthalmic solution Place 1 drop into the left eye every 6 (six) hours for 5 days. 07/25/20 07/30/20  Tommie Sams, DO  fluticasone (FLONASE) 50 MCG/ACT nasal spray Place 2 sprays into both nostrils daily.  07/25/20  [provider]    Family History Family History  Problem  Relation Age of Onset  . COPD Mother   . Atrial fibrillation Father     Social History Social History   Tobacco Use  . Smoking status: Never Smoker  . Smokeless tobacco: Never Used  Vaping Use  . Vaping Use: Never used  Substance Use Topics  . Alcohol use: No  . Drug use: No     Allergies   Patient has no known allergies.   Review of Systems Review of Systems  Eyes: Positive for pain and redness.  Musculoskeletal:       Left heel pain.   Physical Exam Triage Vital Signs ED Triage Vitals  Enc Vitals Group     BP 07/25/20 1517 (!) 146/99     Pulse Rate 07/25/20 1517 86     Resp 07/25/20 1517 16     Temp 07/25/20 1517 98.1 F (36.7 C)     Temp Source 07/25/20 1517 Oral     SpO2 07/25/20 1517 99 %     Weight 07/25/20 1514 215 lb (97.5 kg)     Height 07/25/20 1514 5\' 11"  (1.803 m)     Head Circumference --      Peak Flow --      Pain Score 07/25/20 1513 5     Pain Loc --      Pain Edu? --      Excl.  in GC? --    Updated Vital Signs BP (!) 146/99 (BP Location: Left Arm)   Pulse 86   Temp 98.1 F (36.7 C) (Oral)   Resp 16   Ht 5\' 11"  (1.803 m)   Wt 97.5 kg   SpO2 99%   BMI 29.99 kg/m   Visual Acuity Right Eye Distance: 20/20 uncorrected Left Eye Distance: 20/20 uncorrected Bilateral Distance: 20/20 uncorrected  Right Eye Near:   Left Eye Near:    Bilateral Near:     Physical Exam Vitals and nursing note reviewed.  Constitutional:      General: He is not in acute distress.    Appearance: Normal appearance. He is not ill-appearing.  HENT:     Head: Normocephalic and atraumatic.  Eyes:     Comments: Left eye with mild conjunctival injection.  Left upper eyelid with edema.  Pulmonary:     Effort: Pulmonary effort is normal. No respiratory distress.  Musculoskeletal:     Comments: Left foot with exquisite tenderness palpation at the attachment site of the plantar fascia.  Neurological:     Mental Status: He is alert.  Psychiatric:        Mood  and Affect: Mood normal.        Behavior: Behavior normal.    UC Treatments / Results  Labs (all labs ordered are listed, but only abnormal results are displayed) Labs Reviewed - No data to display  EKG   Radiology No results found.  Procedures Procedures (including critical care time)  Medications Ordered in UC Medications - No data to display  Initial Impression / Assessment and Plan / UC Course  I have reviewed the triage vital signs and the nursing notes.  Pertinent labs & imaging results that were available during my care of the patient were reviewed by me and considered in my medical decision making (see chart for details).    54 year old male presents with stye of the left eye.  Patient also has plantar fasciitis.  Treating with warm compresses and Polytrim.  Meloxicam, heel cups, rest, ice, elevation, and exercises regarding the plantar fasciitis.   Final Clinical Impressions(s) / UC Diagnoses   Final diagnoses:  Hordeolum externum of left upper eyelid  Plantar fasciitis     Discharge Instructions     Medication as prescribed.  Warm compresses to the eye.  Ice, heel cups (in the shoes). Exercises as in the handout (exercises daily, 10x each; holds are for 3-5 secs).  Take care  Dr. 40    ED Prescriptions    Medication Sig Dispense Auth. Provider   meloxicam (MOBIC) 15 MG tablet Take 1 tablet (15 mg total) by mouth daily as needed. 30 tablet Forada, Luceni G, DO   trimethoprim-polymyxin b (POLYTRIM) ophthalmic solution Place 1 drop into the left eye every 6 (six) hours for 5 days. 10 mL 11-[birth year]-1983, DO     PDMP not reviewed this encounter.   Tommie Sams, Tommie Sams 07/25/20 1624

## 2020-07-25 NOTE — ED Triage Notes (Signed)
Patient c/o swelling and redness in his left upper eyelid that started yesterday.  Patient denies any drainage from the eye.  Patient also c/o pain in his left heel for the past month.  Patient denies injury or fall.

## 2020-07-25 NOTE — Discharge Instructions (Signed)
Medication as prescribed.  Warm compresses to the eye.  Ice, heel cups (in the shoes). Exercises as in the handout (exercises daily, 10x each; holds are for 3-5 secs).  Take care  Dr. Adriana Simas

## 2020-08-21 ENCOUNTER — Other Ambulatory Visit: Payer: Self-pay | Admitting: Family Medicine
